# Patient Record
Sex: Female | Born: 2000 | Race: White | Hispanic: No | Marital: Single | Smoking: Never smoker
Health system: Southern US, Community
[De-identification: ages and names within clinical notes are randomized; demographics above are authoritative.]

## PROBLEM LIST (undated history)

## (undated) DIAGNOSIS — F329 Major depressive disorder, single episode, unspecified: Secondary | ICD-10-CM

## (undated) DIAGNOSIS — F32A Depression, unspecified: Secondary | ICD-10-CM

## (undated) DIAGNOSIS — T7840XA Allergy, unspecified, initial encounter: Secondary | ICD-10-CM

## (undated) DIAGNOSIS — F909 Attention-deficit hyperactivity disorder, unspecified type: Secondary | ICD-10-CM

## (undated) DIAGNOSIS — F419 Anxiety disorder, unspecified: Secondary | ICD-10-CM

## (undated) HISTORY — DX: Allergy, unspecified, initial encounter: T78.40XA

## (undated) HISTORY — DX: Major depressive disorder, single episode, unspecified: F32.9

## (undated) HISTORY — DX: Anxiety disorder, unspecified: F41.9

## (undated) HISTORY — DX: Attention-deficit hyperactivity disorder, unspecified type: F90.9

## (undated) HISTORY — DX: Depression, unspecified: F32.A

---
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
## 2019-01-08 ENCOUNTER — Ambulatory Visit (INDEPENDENT_AMBULATORY_CARE_PROVIDER_SITE_OTHER): Payer: Medicaid Other | Admitting: Family Medicine

## 2019-01-08 ENCOUNTER — Encounter: Payer: Self-pay | Admitting: Family Medicine

## 2019-01-08 VITALS — BP 114/67 | HR 89 | Temp 98.6°F | Ht 65.0 in | Wt 223.0 lb

## 2019-01-08 DIAGNOSIS — F411 Generalized anxiety disorder: Secondary | ICD-10-CM | POA: Diagnosis not present

## 2019-01-08 DIAGNOSIS — F329 Major depressive disorder, single episode, unspecified: Secondary | ICD-10-CM | POA: Diagnosis not present

## 2019-01-08 DIAGNOSIS — F909 Attention-deficit hyperactivity disorder, unspecified type: Secondary | ICD-10-CM | POA: Diagnosis not present

## 2019-01-08 DIAGNOSIS — F32A Depression, unspecified: Secondary | ICD-10-CM

## 2019-01-08 DIAGNOSIS — Z7251 High risk heterosexual behavior: Secondary | ICD-10-CM

## 2019-01-08 DIAGNOSIS — Z7689 Persons encountering health services in other specified circumstances: Secondary | ICD-10-CM

## 2019-01-08 DIAGNOSIS — Z23 Encounter for immunization: Secondary | ICD-10-CM

## 2019-01-08 LAB — PREGNANCY, URINE: Preg Test, Ur: NEGATIVE

## 2019-01-08 MED ORDER — FLUOXETINE HCL 20 MG PO TABS: 20.0000 mg | ORAL_TABLET | Freq: Every day | ORAL | 1 refills | Status: DC

## 2019-01-08 NOTE — Progress Notes (Signed)
Subjective: DI:YMEBRAXEN care, depression/ anxiety HPI: Vickie Dillon is a 18 y.o. female presenting to clinic today for:  1. Depression/ anxiety Patient with known history of depression and anxiety.  She is actually under the care of Dr. Lloyd Huger, with psychiatry.  She notes that she was recently prescribed Wellbutrin for depressive symptoms and had told her physician that she also had significant anxiety symptoms that were causing her to miss school.  She was previously treated with Lexapro and was transition from Lexapro to Wellbutrin because it was not working.  However, she does go on to state that she had only taken it intermittently for about 2 weeks before discontinuing the medicine.  She identifies remembering to take her medication as a barrier to treatment.  She is also treated for ADHD with Concerta.  Her next visit with her psychiatrist is in April.  Her mother is present during the exam who notes that she personally had difficulties with Wellbutrin increasing anxiety and does wish to transition her over to something like Prozac.  Family history significant for bipolar disorder in multiple family members.  Mother's history significant for generalized anxiety disorder, PTSD.  Father's history significant for bipolar disorder.  Patient is not on contraception.  Past Medical History:  Diagnosis Date  . Allergy   . Anxiety   . Depression    Past Surgical History:  Procedure Laterality Date  . TONSILLECTOMY AND ADENOIDECTOMY  2006  . TYMPANOSTOMY TUBE PLACEMENT Bilateral 2006   Social History   Socioeconomic History  . Marital status: Single    Spouse name: Not on file  . Number of children: Not on file  . Years of education: Not on file  . Highest education level: Not on file  Occupational History  . 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 Financial resource strain: Not on file  . Food insecurity:    Worry: Not on file    Inability: Not on file  .  Transportation needs:    Medical: Not on file    Non-medical: Not on file  Tobacco Use  . Smoking status: Never Smoker  . Smokeless tobacco: Never Used  Substance and Sexual Activity  . Alcohol use: Not Currently    Comment: occ  . Drug use: Not Currently  . Sexual activity: Yes    Birth control/protection: Condom  Lifestyle  . Physical activity:    Days per week: Not on file    Minutes per session: Not on file  . Stress: Not on file  Relationships  . Social connections:    Talks on phone: Not on file    Gets together: Not on file    Attends religious service: Not on file    Active member of club or organization: Not on file    Attends meetings of clubs or organizations: Not on file    Relationship status: Not on file  . Intimate partner violence:    Fear of current or ex partner: Not on file    Emotionally abused: Not on file    Physically abused: Not on file    Forced sexual activity: Not on file  Other Topics Concern  . Not on file  Social History Narrative   Patient is in her 1st year at [employer] studying psychology.  She has a boyfriend.   Current Meds  Medication Sig  . buPROPion (WELLBUTRIN XL) 150 MG 24 hr tablet Take 150 mg by mouth daily.  Marland Kitchen  methylphenidate 27 MG PO CR tablet Take 27 mg by mouth every morning.   Family History  Problem Relation Age of Onset  . Hypertension Mother   . Hypothyroidism Mother   . Depression Mother   . Anxiety disorder Mother   . Bipolar disorder Mother   . Hyperlipidemia Mother   . Migraines Mother   . Multiple sclerosis Father   . Depression Father   . Bipolar disorder Father   . Hypertension Father   . Anxiety disorder Maternal Grandmother   . Hyperlipidemia Maternal Grandfather   . Hypertension Maternal Grandfather   . Nephrolithiasis Maternal Grandfather   . Diabetes Paternal Grandmother   . Diabetes Paternal Grandfather   . Heart attack Paternal Grandfather   . Anxiety disorder Half-Brother   . Migraines  Half-Sister   . Bipolar disorder Half-Sister   . Anxiety disorder Half-Sister    No Known Allergies   Health Maintenance: Flu shot  ROS: Per HPI  Objective: Office vital signs reviewed. BP 114/67   Pulse 89   Temp 98.6 F (37 C) (Oral)   Ht 5\' 5"  (1.651 m)   Wt 223 lb (101.2 kg)   BMI 37.11 kg/m   Physical Examination:  General: Awake, alert, obese, No acute distress HEENT: Normal, sclera white, MMM Cardio: regular rate and rhythm, S1S2 heard, no murmurs appreciated Pulm: clear to auscultation bilaterally, no wheezes, rhonchi or rales; normal work of breathing on room air Extremities: warm, well perfused, No edema, cyanosis or clubbing; +2 pulses bilaterally Psych: mood stable. Speech normal.  Affect appropriate. Depression screen PHQ 2/9 01/08/2019  Decreased Interest 1  Down, Depressed, Hopeless 1  PHQ - 2 Score 2  Altered sleeping 3  Tired, decreased energy 3  Change in appetite 2  Feeling bad or failure about yourself  3  Trouble concentrating 2  Moving slowly or fidgety/restless 1  Suicidal thoughts 1  PHQ-9 Score 17    GAD 7 : Generalized Anxiety Score 01/08/2019  Nervous, Anxious, on Edge 2  Control/stop worrying 1  Worry too much - different things 3  Trouble relaxing 1  Restless 3  Easily annoyed or irritable 2  Afraid - awful might happen 3  Total GAD 7 Score 15  Anxiety Difficulty Somewhat difficult   Assessment/ Plan: 18 y.o. female   1. Depressive disorder Not controlled.  Start fluoxetine 20 mg daily.  Hold off on Wellbutrin for now.  Urine pregnancy was collected given inconsistent use of contraception.  Follow up in 6 weeks with me unless she sees psychiatry first. - FLUoxetine (PROZAC) 20 MG tablet; Take 1 tablet (20 mg total) by mouth daily.  Dispense: 30 tablet; Refill: 1 - Pregnancy, urine  2. Establishing care with new doctor, encounter for Awaiting records from previous PCP  3. Generalized anxiety disorder Not controlled.  Start  fluoxetine. - FLUoxetine (PROZAC) 20 MG tablet; Take 1 tablet (20 mg total) by mouth daily.  Dispense: 30 tablet; Refill: 1 - Pregnancy, urine  4. Attention deficit hyperactivity disorder (ADHD), unspecified ADHD type Followed by psychiatry.  Manages Concerta  5. High risk heterosexual behavior As above. - Pregnancy, urine   Raliegh Ip, DO [facility] [phone]

## 2019-01-08 NOTE — Patient Instructions (Signed)
Taking the medicine as directed and not missing any doses is one of the best things you can do to treat your anxiety/ depression.  Here are some things to keep in mind:  1) Side effects (stomach upset, some increased anxiety) may happen before you notice a benefit.  These side effects typically go away over time. 2) Changes to your dose of medicine or a change in medication all together is sometimes necessary 3) Most people need to be on medication at least 12 months 4) Many people will notice an improvement within two weeks but the full effect of the medication can take up to 4-6 weeks 5) Stopping the medication when you start feeling better often results in a return of symptoms 6) Never discontinue your medication without contacting a health care professional first.  Some medications require gradual discontinuation/ taper and can make you sick if you stop them abruptly.  If your symptoms worsen or you have thoughts of suicide/homicide, PLEASE SEEK IMMEDIATE MEDICAL ATTENTION.  You may always call:  National Suicide Hotline: 800-273-8255  Crisis Line: 336-832-9700 Crisis Recovery in [place]: 800-939-5911   These are available 24 hours a day, 7 days a week.   

## 2019-01-11 DIAGNOSIS — Z23 Encounter for immunization: Secondary | ICD-10-CM | POA: Diagnosis not present

## 2019-02-25 ENCOUNTER — Other Ambulatory Visit: Payer: Self-pay

## 2019-02-25 ENCOUNTER — Ambulatory Visit (INDEPENDENT_AMBULATORY_CARE_PROVIDER_SITE_OTHER): Payer: Medicaid Other | Admitting: Family Medicine

## 2019-02-25 DIAGNOSIS — F411 Generalized anxiety disorder: Secondary | ICD-10-CM | POA: Diagnosis not present

## 2019-02-25 DIAGNOSIS — F32A Depression, unspecified: Secondary | ICD-10-CM

## 2019-02-25 DIAGNOSIS — F329 Major depressive disorder, single episode, unspecified: Secondary | ICD-10-CM

## 2019-02-25 MED ORDER — FLUOXETINE HCL 40 MG PO CAPS: 40.0000 mg | ORAL_CAPSULE | Freq: Every day | ORAL | 1 refills | Status: DC

## 2019-02-25 NOTE — Progress Notes (Signed)
Telephone visit  Subjective: CC: f/u GAD, depression PCP: Raliegh Ip, DO BUY:[id number] Kulbacki is a 18 y.o. female calls for telephone consult today. Patient provides verbal consent for consult held via phone.  Location of patient: home Location of provider: [facility] Others present for call: none  1. GAD/ depression Patient reports that her family seems to think that she is doing better on the addition of Prozac 20 mg daily.  They feel that her mood is better though she has not seen a great deal of change herself.  She continues to have intermittent panic episodes and notes that she has had 2 since the last visit.  She continues to take Wellbutrin as prescribed as well as her Concerta as prescribed by her specialist.  She continues to feel fidgety and have difficulty with sleep.  No SI, HI.  ROS: Per HPI  No Known Allergies Past Medical History:  Diagnosis Date  . ADHD   . Allergy   . Anxiety   . Depression     Current Outpatient Medications:  .  buPROPion (WELLBUTRIN XL) 150 MG 24 hr tablet, Take 150 mg by mouth daily., Disp: , Rfl:  .  FLUoxetine (PROZAC) 20 MG tablet, Take 1 tablet (20 mg total) by mouth daily., Disp: 30 tablet, Rfl: 1 .  methylphenidate 27 MG PO CR tablet, Take 27 mg by mouth every morning., Disp: , Rfl:   Depression screen [facility] 2/9 02/25/2019 01/08/2019  Decreased Interest 0 1  Down, Depressed, Hopeless 2 1  PHQ - 2 Score 2 2  Altered sleeping 3 3  Tired, decreased energy 2 3  Change in appetite 1 2  Feeling bad or failure about yourself  3 3  Trouble concentrating 0 2  Moving slowly or fidgety/restless 0 1  Suicidal thoughts 0 1  PHQ-9 Score 11 17  Difficult doing work/chores Somewhat difficult -   GAD 7 : Generalized Anxiety Score 02/25/2019 01/08/2019  Nervous, Anxious, on Edge 3 2  Control/stop worrying 2 1  Worry too much - different things 1 3  Trouble relaxing 0 1  Restless 3 3  Easily annoyed or irritable 0 2  Afraid - awful might happen 2 3   Total GAD 7 Score 11 15  Anxiety Difficulty Not difficult at all Somewhat difficult    Assessment/ Plan: 18 y.o. female   1. Depressive disorder Improving but not controlled.  We discussed options including an increase in the SSRI versus addition of BuSpar since most symptoms seem to be predominantly anxiety driven.  She wants to proceed with increase Prozac to 40 mg daily.  She will follow-up with me in about 6 weeks.  The appointment has been scheduled. - FLUoxetine (PROZAC) 40 MG capsule; Take 1 capsule (40 mg total) by mouth daily.  Dispense: 30 capsule; Refill: 1  2. Generalized anxiety disorder As above - FLUoxetine (PROZAC) 40 MG capsule; Take 1 capsule (40 mg total) by mouth daily.  Dispense: 30 capsule; Refill: 1   Start time: 8:00am End time: 8:10am  Total time spent on patient care (including telephone call/ virtual visit): 15 minutes  Ashly Hulen Skains, DO [facility] [phone]

## 2019-03-07 ENCOUNTER — Ambulatory Visit (INDEPENDENT_AMBULATORY_CARE_PROVIDER_SITE_OTHER): Payer: Medicaid Other | Admitting: Family Medicine

## 2019-03-07 ENCOUNTER — Other Ambulatory Visit: Payer: Self-pay

## 2019-03-07 ENCOUNTER — Encounter: Payer: Self-pay | Admitting: Family Medicine

## 2019-03-07 VITALS — BP 111/64 | HR 69 | Temp 97.3°F | Ht 65.0 in | Wt 213.0 lb

## 2019-03-07 DIAGNOSIS — Z113 Encounter for screening for infections with a predominantly sexual mode of transmission: Secondary | ICD-10-CM | POA: Diagnosis not present

## 2019-03-07 NOTE — Patient Instructions (Signed)

## 2019-03-07 NOTE — Progress Notes (Signed)
Subjective:  Patient ID: Vickie Dillon, female    DOB: [birth year], 18 y.o.   MRN: [id number]  Chief Complaint:  Exposure to STD   HPI: Vickie Dillon is a 18 y.o. female presenting on 03/07/2019 for Exposure to STD   1. Routine screening for STI (sexually transmitted infection)   Pt presents today for STI testing. Pt states she would like to be tested for gonorrhea and chlamydia. States she has had chlamydia in the past. States she has only been with one female partner. States she is unsure if he is faithful. States her last unprotected intercourse was in Jan 2020. Pt denies vaginal discharge, bleeding, or pain. No dyspareunia. No abdominal, pelvic, or flank pain. No vaginal pruritis. No dysuria, fever, chills, weakness, or confusion.    Relevant past medical, surgical, family, and social history reviewed and updated as indicated.  Allergies and medications reviewed and updated.   Past Medical History:  Diagnosis Date  . ADHD   . Allergy   . Anxiety   . Depression     Past Surgical History:  Procedure Laterality Date  . TONSILLECTOMY AND ADENOIDECTOMY  2006  . TYMPANOSTOMY TUBE PLACEMENT Bilateral 2006    Social History   Socioeconomic History  . Marital status: Single    Spouse name: Not on file  . Number of children: Not on file  . Years of education: Not on file  . Highest education level: Not on file  Occupational History  . 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 Financial resource strain: Not on file  . Food insecurity:    Worry: Not on file    Inability: Not on file  . Transportation needs:    Medical: Not on file    Non-medical: Not on file  Tobacco Use  . Smoking status: Never Smoker  . Smokeless tobacco: Never Used  Substance and Sexual Activity  . Alcohol use: Not Currently    Comment: occ  . Drug use: Not Currently  . Sexual activity: Yes    Birth control/protection: Condom  Lifestyle  . Physical activity:    Days per  week: Not on file    Minutes per session: Not on file  . Stress: Not on file  Relationships  . Social connections:    Talks on phone: Not on file    Gets together: Not on file    Attends religious service: Not on file    Active member of club or organization: Not on file    Attends meetings of clubs or organizations: Not on file    Relationship status: Not on file  . Intimate partner violence:    Fear of current or ex partner: Not on file    Emotionally abused: Not on file    Physically abused: Not on file    Forced sexual activity: Not on file  Other Topics Concern  . Not on file  Social History Narrative   Patient is in her 1st year at [employer] studying psychology.  She has a boyfriend.    Outpatient Encounter Medications as of 03/07/2019  Medication Sig  . FLUoxetine (PROZAC) 40 MG capsule Take 1 capsule (40 mg total) by mouth daily.  . methylphenidate 27 MG PO CR tablet Take 27 mg by mouth every morning.  Marland Kitchen buPROPion (WELLBUTRIN XL) 150 MG 24 hr tablet Take 150 mg by mouth daily.   No facility-administered encounter medications on file as of 03/07/2019.  No Known Allergies  Review of Systems  Constitutional: Negative for chills, fatigue and fever.  Gastrointestinal: Negative for abdominal pain and rectal pain.  Genitourinary: Negative for decreased urine volume, difficulty urinating, dyspareunia, dysuria, enuresis, flank pain, frequency, genital sores, hematuria, menstrual problem, pelvic pain, urgency, vaginal bleeding, vaginal discharge and vaginal pain.  Musculoskeletal: Negative for arthralgias, joint swelling and myalgias.  Neurological: Negative for weakness and headaches.  Psychiatric/Behavioral: Negative for confusion.  All other systems reviewed and are negative.       Objective:  BP 111/64   Pulse 69   Temp (!) 97.3 F (36.3 C)   Ht 5\' 5"  (1.651 m)   Wt 213 lb (96.6 kg)   BMI 35.45 kg/m    Wt Readings from Last 3 Encounters:  03/07/19 213  lb (96.6 kg) (98 %, Z= 2.12)*  01/08/19 223 lb (101.2 kg) (99 %, Z= 2.24)*   * Growth percentiles are based on CDC (Girls, 2-20 Years) data.    Physical Exam Vitals signs and nursing note reviewed.  Constitutional:      General: She is not in acute distress.    Appearance: Normal appearance. She is not ill-appearing or toxic-appearing.  HENT:     Head: Normocephalic and atraumatic.     Mouth/Throat:     Mouth: Mucous membranes are moist.     Pharynx: Oropharynx is clear.  Eyes:     Pupils: Pupils are equal, round, and reactive to light.  Neck:     Musculoskeletal: Normal range of motion and neck supple.  Cardiovascular:     Rate and Rhythm: Normal rate and regular rhythm.     Heart sounds: Normal heart sounds. No murmur. No friction rub. No gallop.   Pulmonary:     Effort: Pulmonary effort is normal. No respiratory distress.     Breath sounds: Normal breath sounds.  Abdominal:     General: Bowel sounds are normal.     Palpations: Abdomen is soft.     Tenderness: There is no abdominal tenderness. There is no right CVA tenderness or left CVA tenderness.  Genitourinary:    Comments: Pelvic deferred, pt declined Musculoskeletal:        General: No swelling.  Skin:    General: Skin is warm and dry.     Capillary Refill: Capillary refill takes less than 2 seconds.     Coloration: Skin is not pale.  Neurological:     General: No focal deficit present.     Mental Status: She is alert and oriented to person, place, and time.  Psychiatric:        Mood and Affect: Mood normal.        Behavior: Behavior normal.        Thought Content: Thought content normal.        Judgment: Judgment normal.     Results for orders placed or performed in visit on 01/08/19  Pregnancy, urine  Result Value Ref Range   Preg Test, Ur Negative Negative       Pertinent labs & imaging results that were available during my care of the patient were reviewed by me and considered in my medical decision  making.  Assessment & Plan:  [name] was seen today for exposure to std.  Diagnoses and all orders for this visit:  Routine screening for STI (sexually transmitted infection) Pt denies symptoms. Wants to be tested for GC, Chlamydia. Denies blood work for HIV / Hep B. Labs pending. Will treat if warranted. No  sexual contact until labs result and treatment is completed if warranted.  -     Chlamydia/Gonococcus/Trichomonas, NAA     Continue all other maintenance medications.  Follow up plan: Return if symptoms worsen or fail to improve.  Educational handout given for safe sex practices.   The above assessment and management plan was discussed with the patient. The patient verbalized understanding of and has agreed to the management plan. Patient is aware to call the clinic if symptoms persist or worsen. Patient is aware when to return to the clinic for a follow-up visit. Patient educated on when it is appropriate to go to the emergency department.   Kari Baars, FNP-C [facility] [phone]

## 2019-03-11 LAB — CHLAMYDIA/GONOCOCCUS/TRICHOMONAS, NAA
Chlamydia by NAA: NEGATIVE
Gonococcus by NAA: NEGATIVE
Trich vag by NAA: NEGATIVE

## 2019-03-13 ENCOUNTER — Other Ambulatory Visit: Payer: Self-pay

## 2019-03-13 ENCOUNTER — Ambulatory Visit (INDEPENDENT_AMBULATORY_CARE_PROVIDER_SITE_OTHER): Payer: Medicaid Other | Admitting: Family Medicine

## 2019-03-13 DIAGNOSIS — R1032 Left lower quadrant pain: Secondary | ICD-10-CM

## 2019-03-13 NOTE — Progress Notes (Signed)
Telephone visit  Subjective: CC: Abdominal pain PCP: Raliegh Ip, DO [id number] Vickie Dillon is a 18 y.o. female calls for telephone consult today. Patient provides verbal consent for consult held via phone.  Location of patient: home Location of provider: Working remotely from home Others present for call: none  1.  Left-sided abdominal pain Patient reports acute onset of left-sided abdominal pain this morning.  She notes that it is crampy in nature with intermittent sharpness.  She denies any abnormal vaginal discharge, irregular bleeding.  She is had mild nausea over the last 1 to 2 hours but no vomiting.  No fevers.  Her last menstrual cycle was February 03, 2019.  She was evaluated in the office about a week ago for STI testing, as she is sexually active and uses no contraception at this time.  Her STI tests were negative.  She goes on to state that she was using a Nexplanon but this caused severe GI symptoms and therefore it was removed.   ROS: Per HPI  No Known Allergies Past Medical History:  Diagnosis Date  . ADHD   . Allergy   . Anxiety   . Depression     Current Outpatient Medications:  .  buPROPion (WELLBUTRIN XL) 150 MG 24 hr tablet, Take 150 mg by mouth daily., Disp: , Rfl:  .  FLUoxetine (PROZAC) 40 MG capsule, Take 1 capsule (40 mg total) by mouth daily., Disp: 30 capsule, Rfl: 1 .  methylphenidate 27 MG PO CR tablet, Take 27 mg by mouth every morning., Disp: , Rfl:   Assessment/ Plan: 18 y.o. female   1. Acute left lower quadrant pain Patient has missed her period by over a week now.  We cannot rule out pregnancy/ectopic pregnancy.  I have highly recommended that she consider taking a home pregnancy test or coming to the office to have this test done.  At this time, I do not think that her symptoms are related to pelvic infection as she had STI tests done a week ago and they were negative.  I did review her ultrasound of the ovaries from 2019 which demonstrated a  small simple cyst on the left ovary.  Mittelschmerz was considered but at this point she is overdue for menstrual cycle so I do not think that that is likely the diagnosis.  Her symptoms certainly could be related to her trying to start a menstrual cycle and I discussed this with her.  If her pregnancy test is negative, I would recommend proceeding with oral NSAIDs.  We discussed using 400 to 600 mg of Motrin every 6-8 hours as needed pain (this needs to be separate from her SSRI).  We discussed reasons for emergent evaluation emergency department.  She voiced good understanding will follow-up PRN  Start time: 2:54pm End time: 3:02pm  Total time spent on patient care (including telephone call/ virtual visit):  Mysha Peeler Hulen Skains, DO [facility] [phone]

## 2019-03-18 DIAGNOSIS — Z79899 Other long term (current) drug therapy: Secondary | ICD-10-CM | POA: Diagnosis not present

## 2019-03-18 DIAGNOSIS — R079 Chest pain, unspecified: Secondary | ICD-10-CM | POA: Diagnosis not present

## 2019-03-18 DIAGNOSIS — R0689 Other abnormalities of breathing: Secondary | ICD-10-CM | POA: Diagnosis not present

## 2019-03-18 DIAGNOSIS — R0781 Pleurodynia: Secondary | ICD-10-CM | POA: Diagnosis not present

## 2019-04-22 ENCOUNTER — Ambulatory Visit: Payer: Self-pay | Admitting: Family Medicine

## 2019-05-06 ENCOUNTER — Other Ambulatory Visit: Payer: Self-pay

## 2019-05-06 ENCOUNTER — Ambulatory Visit (INDEPENDENT_AMBULATORY_CARE_PROVIDER_SITE_OTHER): Payer: Medicaid Other | Admitting: Family Medicine

## 2019-05-06 DIAGNOSIS — F329 Major depressive disorder, single episode, unspecified: Secondary | ICD-10-CM | POA: Diagnosis not present

## 2019-05-06 DIAGNOSIS — F411 Generalized anxiety disorder: Secondary | ICD-10-CM

## 2019-05-06 DIAGNOSIS — F32A Depression, unspecified: Secondary | ICD-10-CM

## 2019-05-06 MED ORDER — FLUOXETINE HCL 40 MG PO CAPS: 40.0000 mg | ORAL_CAPSULE | Freq: Every day | ORAL | 5 refills | Status: DC

## 2019-05-06 NOTE — Progress Notes (Signed)
Telephone visit  Subjective: CC: f/u GAD, depression PCP: Janora Norlander, DO [id number] Neyra is a 18 y.o. female calls for telephone consult today. Patient provides verbal consent for consult held via phone.  Location of patient: home Location of provider: [facility] Others present for call: none  1. GAD/ depression Dose was increased to Prozac 40 mg daily. She reports that her depressive symptoms have gotten a lot better on the increased dose.  She reports 1 panic episode since last visit.  This is getting better.  She continues to take Wellbutrin as prescribed as well as her Concerta as prescribed by her specialist.  She is unsure as to when she has follow up.  Sleep is really good.  No SI, HI.  ROS: Per HPI  No Known Allergies Past Medical History:  Diagnosis Date  . ADHD   . Allergy   . Anxiety   . Depression     Current Outpatient Medications:  .  buPROPion (WELLBUTRIN XL) 150 MG 24 hr tablet, Take 150 mg by mouth daily., Disp: , Rfl:  .  FLUoxetine (PROZAC) 40 MG capsule, Take 1 capsule (40 mg total) by mouth daily., Disp: 30 capsule, Rfl: 1 .  methylphenidate 27 MG PO CR tablet, Take 27 mg by mouth every morning., Disp: , Rfl:   Depression screen [facility] 2/9 05/06/2019 02/25/2019 01/08/2019  Decreased Interest 1 0 1  Down, Depressed, Hopeless 1 2 1   PHQ - 2 Score 2 2 2   Altered sleeping 1 3 3   Tired, decreased energy 0 2 3  Change in appetite 1 1 2   Feeling bad or failure about yourself  3 3 3   Trouble concentrating 0 0 2  Moving slowly or fidgety/restless 0 0 1  Suicidal thoughts 1 0 1  PHQ-9 Score 8 11 17   Difficult doing work/chores - Somewhat difficult -   GAD 7 : Generalized Anxiety Score 05/06/2019 02/25/2019 01/08/2019  Nervous, Anxious, on Edge 2 3 2   Control/stop worrying 2 2 1   Worry too much - different things 3 1 3   Trouble relaxing 1 0 1  Restless 3 3 3   Easily annoyed or irritable 0 0 2  Afraid - awful might happen 2 2 3   Total GAD 7 Score 13 11 15    Anxiety Difficulty Somewhat difficult Not difficult at all Somewhat difficult    Assessment/ Plan: 18 y.o. female   1. Depressive disorder Under better control than previous visit.  She would like to stick with a 40 mg daily, which she is intermittently compliant with.  We reinforced need for daily use of this medicine.  She will follow-up in 3 to 6 months, sooner if needed - FLUoxetine (PROZAC) 40 MG capsule; Take 1 capsule (40 mg total) by mouth daily.  Dispense: 30 capsule; Refill: 5  2. Generalized anxiety disorder Essentially stable.  This seems to wax and wane.  She does not want to have any medication adjustments at this time.  I encouraged her to continue following up with her psychiatric provider, which she will schedule an appointment with today. - FLUoxetine (PROZAC) 40 MG capsule; Take 1 capsule (40 mg total) by mouth daily.  Dispense: 30 capsule; Refill: 5    Start time: 10:11am End time: 10:18am  Total time spent on patient care (including telephone call/ virtual visit): 13 minutes  [place], [place] [phone]

## 2019-05-28 ENCOUNTER — Ambulatory Visit (INDEPENDENT_AMBULATORY_CARE_PROVIDER_SITE_OTHER): Payer: Medicaid Other | Admitting: Family Medicine

## 2019-05-28 ENCOUNTER — Encounter: Payer: Self-pay | Admitting: Family Medicine

## 2019-05-28 ENCOUNTER — Other Ambulatory Visit: Payer: Self-pay

## 2019-05-28 VITALS — BP 106/66 | HR 77 | Temp 98.0°F | Ht 60.0 in | Wt 228.0 lb

## 2019-05-28 DIAGNOSIS — Z7251 High risk heterosexual behavior: Secondary | ICD-10-CM | POA: Diagnosis not present

## 2019-05-28 DIAGNOSIS — N760 Acute vaginitis: Secondary | ICD-10-CM

## 2019-05-28 DIAGNOSIS — B9689 Other specified bacterial agents as the cause of diseases classified elsewhere: Secondary | ICD-10-CM

## 2019-05-28 DIAGNOSIS — Z202 Contact with and (suspected) exposure to infections with a predominantly sexual mode of transmission: Secondary | ICD-10-CM | POA: Diagnosis not present

## 2019-05-28 LAB — WET PREP FOR TRICH, YEAST, CLUE
Clue Cell Exam: POSITIVE — AB
Trichomonas Exam: NEGATIVE
Yeast Exam: NEGATIVE

## 2019-05-28 LAB — PREGNANCY, URINE: Preg Test, Ur: NEGATIVE

## 2019-05-28 MED ORDER — AZITHROMYCIN 500 MG PO TABS: 1000.0000 mg | ORAL_TABLET | Freq: Once | ORAL | 0 refills | Status: AC

## 2019-05-28 MED ORDER — METRONIDAZOLE 500 MG PO TABS: 500.0000 mg | ORAL_TABLET | Freq: Two times a day (BID) | ORAL | 0 refills | Status: DC

## 2019-05-28 NOTE — Progress Notes (Signed)
Subjective:  Patient ID: Vickie Dillon, female    DOB: 09/05/2001, 18 y.o.   MRN: [id number]  Patient Care Team: Raliegh IpGottschalk, Ashly M, DO as PCP - General (Family Medicine)   Chief Complaint:  Exposure to STD (wants to be checked)   HPI: Vickie Dillon is a 18 y.o. female presenting on 05/28/2019 for Exposure to STD (wants to be checked)  Pt states her partner notified her that he tested positive for chlamydia. Pt states she has had unprotected sex with him within the last month. She denies vaginal symptoms. No bleeding, discharge, pain, or pruritis. Pt denies dyspareunia. No abdominal pain, dysuria, or flank pain. No arthralgias, myalgias, genital lesions, or joint swelling. She has had unprotected with one other female partner who has been notified.    Relevant past medical, surgical, family, and social history reviewed and updated as indicated.  Allergies and medications reviewed and updated. Date reviewed: Chart in Epic.   Past Medical History:  Diagnosis Date  . ADHD   . Allergy   . Anxiety   . Depression     Past Surgical History:  Procedure Laterality Date  . TONSILLECTOMY AND ADENOIDECTOMY  2006  . TYMPANOSTOMY TUBE PLACEMENT Bilateral 2006    Social History   Socioeconomic History  . Marital status: Single    Spouse name: Not on file  . Number of children: Not on file  . Years of education: Not on file  . Highest education level: Not on file  Occupational History  . 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 Financial resource strain: Not on file  . Food insecurity    Worry: Not on file    Inability: Not on file  . Transportation needs    Medical: Not on file    Non-medical: Not on file  Tobacco Use  . Smoking status: Never Smoker  . Smokeless tobacco: Never Used  Substance and Sexual Activity  . Alcohol use: Not Currently    Comment: occ  . Drug use: Not Currently  . Sexual activity: Yes    Birth control/protection: Condom   Lifestyle  . Physical activity    Days per week: Not on file    Minutes per session: Not on file  . Stress: Not on file  Relationships  . Social Musicianconnections    Talks on phone: Not on file    Gets together: Not on file    Attends religious service: Not on file    Active member of club or organization: Not on file    Attends meetings of clubs or organizations: Not on file    Relationship status: Not on file  . Intimate partner violence    Fear of current or ex partner: Not on file    Emotionally abused: Not on file    Physically abused: Not on file    Forced sexual activity: Not on file  Other Topics Concern  . Not on file  Social History Narrative   Patient is in her 1st year at [employer] studying psychology.  She has a boyfriend.    Outpatient Encounter Medications as of 05/28/2019  Medication Sig  . FLUoxetine (PROZAC) 40 MG capsule Take 1 capsule (40 mg total) by mouth daily.  . methylphenidate 27 MG PO CR tablet Take 27 mg by mouth every morning.  Marland Kitchen. azithromycin (ZITHROMAX) 500 MG tablet Take 2 tablets (1,000 mg total) by mouth once for 1 dose.  . metroNIDAZOLE (FLAGYL)  500 MG tablet Take 1 tablet (500 mg total) by mouth 2 (two) times daily.  . [DISCONTINUED] buPROPion (WELLBUTRIN XL) 150 MG 24 hr tablet Take 150 mg by mouth daily.   No facility-administered encounter medications on file as of 05/28/2019.     No Known Allergies  Review of Systems  Constitutional: Negative for activity change, appetite change, chills, diaphoresis, fatigue, fever and unexpected weight change.  Respiratory: Negative for cough, chest tightness and shortness of breath.   Cardiovascular: Negative for chest pain, palpitations and leg swelling.  Gastrointestinal: Negative for abdominal distention, abdominal pain, anal bleeding, blood in stool, constipation, diarrhea, nausea, rectal pain and vomiting.  Genitourinary: Negative for decreased urine volume, difficulty urinating, dyspareunia,  dysuria, enuresis, flank pain, frequency, genital sores, hematuria, menstrual problem, pelvic pain, urgency, vaginal bleeding, vaginal discharge and vaginal pain.  Musculoskeletal: Negative for arthralgias, joint swelling and myalgias.  Skin: Negative for color change, pallor, rash and wound.  Neurological: Negative for dizziness, weakness, light-headedness and headaches.  Psychiatric/Behavioral: Negative for confusion.  All other systems reviewed and are negative.       Objective:  BP 106/66   Pulse 77   Temp 98 F (36.7 C) (Oral)   Ht 5' (1.524 m)   Wt 228 lb (103.4 kg)   LMP 05/05/2019   BMI 44.53 kg/m    Wt Readings from Last 3 Encounters:  05/28/19 228 lb (103.4 kg)  03/07/19 213 lb (96.6 kg) (98 %, Z= 2.12)*  01/08/19 223 lb (101.2 kg) (99 %, Z= 2.24)*   * Growth percentiles are based on CDC (Girls, 2-20 Years) data.    Physical Exam Vitals signs and nursing note reviewed.  Constitutional:      General: She is not in acute distress.    Appearance: Normal appearance. She is well-developed and well-groomed. She is not ill-appearing, toxic-appearing or diaphoretic.  HENT:     Head: Normocephalic and atraumatic.     Jaw: There is normal jaw occlusion.     Right Ear: Hearing normal.     Left Ear: Hearing normal.     Nose: Nose normal.     Mouth/Throat:     Lips: Pink.     Mouth: Mucous membranes are moist.     Pharynx: Oropharynx is clear. Uvula midline.  Eyes:     General: Lids are normal.     Extraocular Movements: Extraocular movements intact.     Conjunctiva/sclera: Conjunctivae normal.     Pupils: Pupils are equal, round, and reactive to light.  Neck:     Musculoskeletal: Normal range of motion and neck supple.     Thyroid: No thyroid mass, thyromegaly or thyroid tenderness.     Vascular: No carotid bruit or JVD.     Trachea: Trachea and phonation normal.  Cardiovascular:     Rate and Rhythm: Normal rate and regular rhythm.     Chest Wall: PMI is not  displaced.     Pulses: Normal pulses.     Heart sounds: Normal heart sounds. No murmur. No friction rub. No gallop.   Pulmonary:     Effort: Pulmonary effort is normal. No respiratory distress.     Breath sounds: Normal breath sounds. No wheezing.  Abdominal:     General: Bowel sounds are normal. There is no distension or abdominal bruit.     Palpations: Abdomen is soft. There is no hepatomegaly or splenomegaly.     Tenderness: There is no abdominal tenderness. There is no right CVA tenderness or left  CVA tenderness.     Hernia: No hernia is present.  Genitourinary:    Comments: Pt declined pelvic, pt opted for self wet prep Musculoskeletal: Normal range of motion.     Right lower leg: No edema.     Left lower leg: No edema.  Lymphadenopathy:     Cervical: No cervical adenopathy.  Skin:    General: Skin is warm and dry.     Capillary Refill: Capillary refill takes less than 2 seconds.     Coloration: Skin is not cyanotic, jaundiced or pale.     Findings: No rash.  Neurological:     General: No focal deficit present.     Mental Status: She is alert and oriented to person, place, and time.     Cranial Nerves: Cranial nerves are intact.     Sensory: Sensation is intact.     Motor: Motor function is intact.     Coordination: Coordination is intact.     Gait: Gait is intact.     Deep Tendon Reflexes: Reflexes are normal and symmetric.  Psychiatric:        Attention and Perception: Attention and perception normal.        Mood and Affect: Mood and affect normal.        Speech: Speech normal.        Behavior: Behavior normal. Behavior is cooperative.        Thought Content: Thought content normal.        Cognition and Memory: Cognition and memory normal.        Judgment: Judgment normal.     Results for orders placed or performed in visit on 05/28/19  WET PREP FOR TRICH, YEAST, CLUE   Specimen: Vaginal Fluid   VAGINAL FLUI  Result Value Ref Range   Trichomonas Exam Negative  Negative   Yeast Exam Negative Negative   Clue Cell Exam Positive (A) Negative  Pregnancy, urine  Result Value Ref Range   Preg Test, Ur Negative Negative     Urinalysis unremarkable in office Urine pregnancy negative Wet Pre positive for clue cells, negative for trich or yeast  Pertinent labs & imaging results that were available during my care of the patient were reviewed by me and considered in my medical decision making.  Assessment & Plan:  [name] was seen today for exposure to std.  Diagnoses and all orders for this visit:  Exposure to STD Exposure to chlamydia Partner tested positive for chlamydia. Will empirically treat with zithromax today. Other STI screening pending, will treat if warranted. Safe sex practices discussed. Report any new symptoms.  -     Chlamydia/Gonococcus/Trichomonas, NAA -     WET PREP FOR TRICH, YEAST, CLUE -     Pregnancy, urine -     STD Screen (8) -     azithromycin (ZITHROMAX) 500 MG tablet; Take 2 tablets (1,000 mg total) by mouth once for 1 dose.  High risk heterosexual behavior -     Chlamydia/Gonococcus/Trichomonas, NAA -     WET PREP FOR TRICH, YEAST, CLUE -     Pregnancy, urine -     STD Screen (8)  Bacterial vaginosis Wet prep positive for clue cells. Will treat with flagyl. No ETOH while taking medications. Vaginal hygiene discussed. Report any new or worsening symptoms.  -     metroNIDAZOLE (FLAGYL) 500 MG tablet; Take 1 tablet (500 mg total) by mouth 2 (two) times daily.   Continue all other maintenance medications.  Follow  up plan: Return if symptoms worsen or fail to improve.  Educational handout given for safe sex and vaginal hygiene   The above assessment and management plan was discussed with the patient. The patient verbalized understanding of and has agreed to the management plan. Patient is aware to call the clinic if symptoms persist or worsen. Patient is aware when to return to the clinic for a follow-up visit.  Patient educated on when it is appropriate to go to the emergency department.   Kari BaarsMichelle , FNP-C [facility] [phone] 05/28/19

## 2019-05-28 NOTE — Patient Instructions (Addendum)
Safe Sex Practicing safe sex means taking steps before and during sex to reduce your risk of:  Getting an STI (sexually transmitted infection).  Giving your partner an STI.  Unwanted or unplanned pregnancy. How can I practice safe sex?     Ways you can practice safe sex  Limit your sexual partners to only one partner who is having sex with only you.  Avoid using alcohol and drugs before having sex. Alcohol and drugs can affect your judgment.  Before having sex with a new partner: ? Talk to your partner about past partners, past STIs, and drug use. ? Get screened for STIs and discuss the results with your partner. Ask your partner to get screened, too.  Check your body regularly for sores, blisters, rashes, or unusual discharge. If you notice any of these problems, visit your health care provider.  Avoid sexual contact if you have symptoms of an infection or you are being treated for an STI.  While having sex, use a condom. Make sure to: ? Use a condom every time you have vaginal, oral, or anal sex. Both females and males should wear condoms during oral sex. ? Keep condoms in place from the beginning to the end of sexual activity. ? Use a latex condom, if possible. Latex condoms offer the best protection. ? Use only water-based lubricants with a condom. Using petroleum-based lubricants or oils will weaken the condom and increase the chance that it will break. Ways your health care provider can help you practice safe sex  See your health care provider for regular screenings, exams, and tests for STIs.  Talk with your health care provider about what kind of birth control (contraception) is best for you.  Get vaccinated against hepatitis B and human papillomavirus (HPV).  If you are at risk of being infected with HIV (human immunodeficiency virus), talk with your health care provider about taking a prescription medicine to prevent HIV infection. You are at risk for HIV if you: ?  Are a man who has sex with other men. ? Are sexually active with more than one partner. ? Take drugs by injection. ? Have a sex partner who has HIV. ? Have unprotected sex. ? Have sex with someone who has sex with both men and women. ? Have had an STI. Follow these instructions at home:  Take over-the-counter and prescription medicines as told by your health care provider.  Keep all follow-up visits as told by your health care provider. This is important. Where to find more information  Centers for Disease Control and Prevention: https://www.cdc.gov/std/prevention/default.htm  Planned Parenthood: https://www.plannedparenthood.org/  Office on Women's Health: https://www.womenshealth.gov/a-z-topics/sexually-transmitted-infections Summary  Practicing safe sex means taking steps before and during sex to reduce your risk of STIs, giving your partner STIs, and having an unwanted or unplanned pregnancy.  Before having sex with a new partner, talk to your partner about past partners, past STIs, and drug use.  Use a condom every time you have vaginal, oral, or anal sex. Both females and males should wear condoms during oral sex.  Check your body regularly for sores, blisters, rashes, or unusual discharge. If you notice any of these problems, visit your health care provider.  See your health care provider for regular screenings, exams, and tests for STIs. This information is not intended to replace advice given to you by your health care provider. Make sure you discuss any questions you have with your health care provider. Document Released: 12/01/2004 Document Revised: 02/15/2019 Document Reviewed: 08/06/2018   Elsevier Patient Education  2020 [facility] vaginal hygiene practices   -  Avoid sleeper pajamas. Nightgowns allow air to circulate.  Sleep without underpants whenever possible.  -  Wear cotton underpants during the day. Double-rinse underwear after washing to avoid  residual irritants. Do not use fabric softeners for underwear and swimsuits.  - Avoid tights, leotards, leggings, "skinny" jeans, and other tight-fitting clothing. Skirts and loose-fitting pants allow air to circulate.  - Avoid pantyliners.  Instead use tampons or cotton pads.  - Daily warm bathing is helpful:     - Soak in clean water (no soap) for 10 to 15 minutes.     - Use soap to wash regions other than the genital area just before getting out of the tub or drain water and take a shower to wash your body. Limit use of any soap on genital areas. Use   fragance-free soaps.     - Rinse the genital area well and gently pat dry.  Don't rub.  Hair dryer to assist with drying can be used only if on cool setting.     - Do not use bubble baths or perfumed soaps.  - Do not use any feminine sprays, douches or powders.  These contain chemicals that will irritate the skin.  - If the genital area is tender or swollen, cool compresses may relieve the discomfort. Unscented wet wipes can be used instead of toilet paper for wiping.   - Emollients, such as Vaseline, may help protect skin and can be applied to the irritated area.  - Always remember to wipe front-to-back after bowel movements. Pat dry after urination.  - Do not sit in wet swimsuits for long periods of time after swimming

## 2019-05-29 LAB — STD SCREEN (8)
HIV Screen 4th Generation wRfx: NONREACTIVE
HSV 1 Glycoprotein G Ab, IgG: <0.91 index (ref 0.00–0.90)
HSV 2 IgG, Type Spec: <0.91 index (ref 0.00–0.90)
Hep A IgM: NEGATIVE
Hep B C IgM: NEGATIVE
Hep C Virus Ab: <0.1 s/co ratio (ref 0.0–0.9)
Hepatitis B Surface Ag: NEGATIVE
RPR Ser Ql: NONREACTIVE

## 2019-05-30 ENCOUNTER — Ambulatory Visit (INDEPENDENT_AMBULATORY_CARE_PROVIDER_SITE_OTHER): Payer: Medicaid Other | Admitting: Family Medicine

## 2019-05-30 ENCOUNTER — Other Ambulatory Visit: Payer: Self-pay

## 2019-05-30 ENCOUNTER — Encounter: Payer: Self-pay | Admitting: Family Medicine

## 2019-05-30 DIAGNOSIS — H6591 Unspecified nonsuppurative otitis media, right ear: Secondary | ICD-10-CM

## 2019-05-30 MED ORDER — CETIRIZINE HCL 10 MG PO TABS: 10.0000 mg | ORAL_TABLET | Freq: Every day | ORAL | 11 refills | Status: DC

## 2019-05-30 MED ORDER — FLUTICASONE PROPIONATE 50 MCG/ACT NA SUSP: 2.0000 | Freq: Every day | NASAL | 6 refills | Status: DC

## 2019-05-30 NOTE — Progress Notes (Signed)
Virtual Visit via telephone Note Due to COVID-19 pandemic this visit was conducted virtually. This visit type was conducted due to national recommendations for restrictions regarding the COVID-19 Pandemic (e.g. social distancing, sheltering in place) in an effort to limit this patient's exposure and mitigate transmission in our community. All issues noted in this document were discussed and addressed.  A physical exam was not performed with this format.   I connected with Vickie SlackMakayla Raimondo on 05/30/19 at 0900 by telephone and verified that I am speaking with the correct person using two identifiers. Vickie SlackMakayla Sebo is currently located at home and family is currently with them during visit. The provider, Kari BaarsMichelle , FNP is located in their office at time of visit.  I discussed the limitations, risks, security and privacy concerns of performing an evaluation and management service by telephone and the availability of in person appointments. I also discussed with the patient that there may be a patient responsible charge related to this service. The patient expressed understanding and agreed to proceed.  Subjective:  Patient ID: Vickie SlackMakayla Gartner, female    DOB: 12/06/2000, 18 y.o.   MRN: [id number]  Chief Complaint:  Otalgia (right)   HPI: Vickie SlackMakayla Layton is a 18 y.o. female presenting on 05/30/2019 for Otalgia (right)   Pt reports the feeling of fullness in her right ear. States she has a popping sound in her ear. States the ear aches if she tilts to the side. No real pain. No fever, chills, sore throat, cough, drainage, or headache. No adenopathy.   Otalgia  There is pain in the right ear. This is a new problem. The current episode started yesterday. The problem occurs every few minutes. The problem has been waxing and waning. There has been no fever. The pain is at a severity of 0/10. The patient is experiencing no pain. Pertinent negatives include no abdominal pain, coughing, diarrhea, ear discharge,  headaches, hearing loss, neck pain, rash, rhinorrhea, sore throat or vomiting. She has tried nothing for the symptoms.     Relevant past medical, surgical, family, and social history reviewed and updated as indicated.  Allergies and medications reviewed and updated.   Past Medical History:  Diagnosis Date  . ADHD   . Allergy   . Anxiety   . Depression     Past Surgical History:  Procedure Laterality Date  . TONSILLECTOMY AND ADENOIDECTOMY  2006  . TYMPANOSTOMY TUBE PLACEMENT Bilateral 2006    Social History   Socioeconomic History  . Marital status: Single    Spouse name: Not on file  . Number of children: Not on file  . Years of education: Not on file  . Highest education level: Not on file  Occupational History  . 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 Financial resource strain: Not on file  . Food insecurity    Worry: Not on file    Inability: Not on file  . Transportation needs    Medical: Not on file    Non-medical: Not on file  Tobacco Use  . Smoking status: Never Smoker  . Smokeless tobacco: Never Used  Substance and Sexual Activity  . Alcohol use: Not Currently    Comment: occ  . Drug use: Not Currently  . Sexual activity: Yes    Birth control/protection: Condom  Lifestyle  . Physical activity    Days per week: Not on file    Minutes per session: Not on file  . Stress: Not  on file  Relationships  . Social Musicianconnections    Talks on phone: Not on file    Gets together: Not on file    Attends religious service: Not on file    Active member of club or organization: Not on file    Attends meetings of clubs or organizations: Not on file    Relationship status: Not on file  . Intimate partner violence    Fear of current or ex partner: Not on file    Emotionally abused: Not on file    Physically abused: Not on file    Forced sexual activity: Not on file  Other Topics Concern  . Not on file  Social History Narrative    Patient is in her 1st year at [employer] studying [employer].  She has a boyfriend.    Outpatient Encounter Medications as of 05/30/2019  Medication Sig  . cetirizine (ZYRTEC) 10 MG tablet Take 1 tablet (10 mg total) by mouth daily.  Marland Kitchen. FLUoxetine (PROZAC) 40 MG capsule Take 1 capsule (40 mg total) by mouth daily.  . fluticasone (FLONASE) 50 MCG/ACT nasal spray Place 2 sprays into both nostrils daily.  . methylphenidate 27 MG PO CR tablet Take 27 mg by mouth every morning.  . metroNIDAZOLE (FLAGYL) 500 MG tablet Take 1 tablet (500 mg total) by mouth 2 (two) times daily.   No facility-administered encounter medications on file as of 05/30/2019.     No Known Allergies  Review of Systems  Constitutional: Negative for activity change, appetite change, chills, diaphoresis, fatigue, fever and unexpected weight change.  HENT: Positive for ear pain and tinnitus. Negative for congestion, dental problem, drooling, ear discharge, facial swelling, hearing loss, mouth sores, nosebleeds, postnasal drip, rhinorrhea, sinus pressure, sinus pain, sore throat, trouble swallowing and voice change.   Respiratory: Negative for cough and shortness of breath.   Cardiovascular: Negative for chest pain and palpitations.  Gastrointestinal: Negative for abdominal pain, diarrhea and vomiting.  Musculoskeletal: Negative for arthralgias, myalgias and neck pain.  Skin: Negative for rash.  Neurological: Negative for dizziness, light-headedness and headaches.  Hematological: Negative for adenopathy.         Observations/Objective: No vital signs or physical exam, this was a telephone or virtual health encounter.  Pt alert and oriented, answers all questions appropriately, and able to speak in full sentences.    Assessment and Plan: [name] was seen today for otalgia.  Diagnoses and all orders for this visit:  Right otitis media with effusion Reported symptoms consistent with otitis media with effusion. No  symptoms concerning for acute bacterial infection. Will treat symptomatically with below. Pt aware to report any new or worsening symptoms. Pt aware if she develops fever or otalgia, to call office, she may require antibiotics.  -     cetirizine (ZYRTEC) 10 MG tablet; Take 1 tablet (10 mg total) by mouth daily. -     fluticasone (FLONASE) 50 MCG/ACT nasal spray; Place 2 sprays into both nostrils daily.     Follow Up Instructions: Return if symptoms worsen or fail to improve.    I discussed the assessment and treatment plan with the patient. The patient was provided an opportunity to ask questions and all were answered. The patient agreed with the plan and demonstrated an understanding of the instructions.   The patient was advised to call back or seek an in-person evaluation if the symptoms worsen or if the condition fails to improve as anticipated.  The above assessment and management plan was  discussed with the patient. The patient verbalized understanding of and has agreed to the management plan. Patient is aware to call the clinic if symptoms persist or worsen. Patient is aware when to return to the clinic for a follow-up visit. Patient educated on when it is appropriate to go to the emergency department.    I provided 15 minutes of non-face-to-face time during this encounter. The call started at 0900. The call ended at 0915. The other time was used for coordination of care.    Monia Pouch, FNP-C [facility] [address] [place], [place] [address] [phone] 05/30/19

## 2019-05-31 ENCOUNTER — Telehealth: Payer: Self-pay | Admitting: Family Medicine

## 2019-05-31 NOTE — Telephone Encounter (Signed)
Awaiting last STI result. Will call pt when all tests have resulted.

## 2019-05-31 NOTE — Telephone Encounter (Signed)
Vickie Dillon saw her on 05/28/19 and ordered labs, they are in her in basket, one test pending

## 2019-06-03 DIAGNOSIS — F331 Major depressive disorder, recurrent, moderate: Secondary | ICD-10-CM | POA: Diagnosis not present

## 2019-06-03 DIAGNOSIS — F902 Attention-deficit hyperactivity disorder, combined type: Secondary | ICD-10-CM | POA: Diagnosis not present

## 2019-06-03 DIAGNOSIS — F411 Generalized anxiety disorder: Secondary | ICD-10-CM | POA: Diagnosis not present

## 2019-06-03 DIAGNOSIS — Z79899 Other long term (current) drug therapy: Secondary | ICD-10-CM | POA: Diagnosis not present

## 2019-06-06 LAB — CHLAMYDIA/GONOCOCCUS/TRICHOMONAS, NAA
Chlamydia by NAA: POSITIVE — AB
Gonococcus by NAA: NEGATIVE
Trich vag by NAA: NEGATIVE

## 2019-06-26 DIAGNOSIS — M7989 Other specified soft tissue disorders: Secondary | ICD-10-CM | POA: Diagnosis not present

## 2019-06-26 DIAGNOSIS — M25472 Effusion, left ankle: Secondary | ICD-10-CM | POA: Diagnosis not present

## 2019-06-26 DIAGNOSIS — S93402A Sprain of unspecified ligament of left ankle, initial encounter: Secondary | ICD-10-CM | POA: Diagnosis not present

## 2019-07-01 DIAGNOSIS — F331 Major depressive disorder, recurrent, moderate: Secondary | ICD-10-CM | POA: Diagnosis not present

## 2019-07-01 DIAGNOSIS — F902 Attention-deficit hyperactivity disorder, combined type: Secondary | ICD-10-CM | POA: Diagnosis not present

## 2019-07-01 DIAGNOSIS — F411 Generalized anxiety disorder: Secondary | ICD-10-CM | POA: Diagnosis not present

## 2019-07-23 DIAGNOSIS — R1031 Right lower quadrant pain: Secondary | ICD-10-CM | POA: Diagnosis not present

## 2019-07-23 DIAGNOSIS — Z79899 Other long term (current) drug therapy: Secondary | ICD-10-CM | POA: Diagnosis not present

## 2019-07-23 DIAGNOSIS — F988 Other specified behavioral and emotional disorders with onset usually occurring in childhood and adolescence: Secondary | ICD-10-CM | POA: Diagnosis not present

## 2019-07-23 DIAGNOSIS — F329 Major depressive disorder, single episode, unspecified: Secondary | ICD-10-CM | POA: Diagnosis not present

## 2019-07-23 DIAGNOSIS — R102 Pelvic and perineal pain: Secondary | ICD-10-CM | POA: Diagnosis not present

## 2019-07-28 DIAGNOSIS — S6982XA Other specified injuries of left wrist, hand and finger(s), initial encounter: Secondary | ICD-10-CM | POA: Diagnosis not present

## 2019-07-28 DIAGNOSIS — S61231A Puncture wound without foreign body of left index finger without damage to nail, initial encounter: Secondary | ICD-10-CM | POA: Diagnosis not present

## 2019-07-28 DIAGNOSIS — W460XXA Contact with hypodermic needle, initial encounter: Secondary | ICD-10-CM | POA: Diagnosis not present

## 2019-08-09 ENCOUNTER — Other Ambulatory Visit: Payer: Self-pay

## 2019-08-09 ENCOUNTER — Ambulatory Visit (INDEPENDENT_AMBULATORY_CARE_PROVIDER_SITE_OTHER): Payer: Medicaid Other | Admitting: Nurse Practitioner

## 2019-08-09 ENCOUNTER — Encounter: Payer: Self-pay | Admitting: Nurse Practitioner

## 2019-08-09 VITALS — BP 109/66 | HR 68 | Temp 97.3°F | Resp 16 | Ht 60.0 in | Wt 232.0 lb

## 2019-08-09 DIAGNOSIS — L02229 Furuncle of trunk, unspecified: Secondary | ICD-10-CM

## 2019-08-09 MED ORDER — CEPHALEXIN 500 MG PO CAPS: 500.0000 mg | ORAL_CAPSULE | Freq: Three times a day (TID) | ORAL | 0 refills | Status: DC

## 2019-08-09 NOTE — Progress Notes (Signed)
   Subjective:    Patient ID: Vickie Dillon, female    DOB: 2001-09-11, 18 y.o.   MRN: [id number]   Chief Complaint: Possible ingrown hair   HPI Patient comes in c/o bumop in her privat e area that is sore to touch. She noticed it yesterday morning. She denies any drainage.   Review of Systems  Constitutional: Negative for activity change and appetite change.  HENT: Negative.   Eyes: Negative for pain.  Respiratory: Negative for shortness of breath.   Cardiovascular: Negative for chest pain, palpitations and leg swelling.  Gastrointestinal: Negative for abdominal pain.  Endocrine: Negative for polydipsia.  Genitourinary: Negative.   Skin: Negative for rash.  Neurological: Negative for dizziness, weakness and headaches.  Hematological: Does not bruise/bleed easily.  Psychiatric/Behavioral: Negative.   All other systems reviewed and are negative.      Objective:   Physical Exam Vitals signs and nursing note reviewed.  Constitutional:      Appearance: Normal appearance.  Cardiovascular:     Rate and Rhythm: Normal rate and regular rhythm.     Heart sounds: Normal heart sounds.  Pulmonary:     Effort: Pulmonary effort is normal.     Breath sounds: Normal breath sounds.  Genitourinary:    Comments: 1cm raised fluctuant lesion just above clitoral area in midline of pubis- slight erythema and tender to touch- no drainage. Skin:    General: Skin is warm and dry.  Neurological:     General: No focal deficit present.     Mental Status: She is alert and oriented to person, place, and time.     BP 109/66   Pulse 68   Temp (!) 97.3 F (36.3 C) (Oral)   Resp 16   Ht 5' (1.524 m)   Wt 232 lb (105.2 kg)   SpO2 96%   BMI 45.31 kg/m        Assessment & Plan:  Vickie Dillon in today with chief complaint of Possible ingrown hair   1. Furuncle of pubic region Meds ordered this encounter  Medications  . cephALEXin (KEFLEX) 500 MG capsule    Sig: Take 1 capsule (500 mg  total) by mouth 3 (three) times daily.    Dispense:  21 capsule    Refill:  0    Order Specific Question:   Supervising Provider    Answer:   Caryl Pina A [[id number]]   Do not pick or scratch at area. Warm compresses BID RTO prn  Mary-Margaret Hassell Done, FNP

## 2019-08-09 NOTE — Patient Instructions (Signed)

## 2019-10-20 DIAGNOSIS — Z3200 Encounter for pregnancy test, result unknown: Secondary | ICD-10-CM | POA: Diagnosis not present

## 2019-10-20 DIAGNOSIS — R112 Nausea with vomiting, unspecified: Secondary | ICD-10-CM | POA: Diagnosis not present

## 2019-10-22 ENCOUNTER — Ambulatory Visit (INDEPENDENT_AMBULATORY_CARE_PROVIDER_SITE_OTHER): Payer: Medicaid Other | Admitting: Family Medicine

## 2019-10-22 ENCOUNTER — Encounter: Payer: Self-pay | Admitting: Family Medicine

## 2019-10-22 DIAGNOSIS — R1110 Vomiting, unspecified: Secondary | ICD-10-CM

## 2019-10-22 NOTE — Progress Notes (Signed)
Virtual Visit via Telephone Note  I connected with Vickie Dillon on 10/22/19 at 9:27 AM by telephone and verified that I am speaking with the correct person using two identifiers. Vickie Dillon is currently located at home and her mother is currently with her during this visit. The provider, Loman Brooklyn, FNP is located in their office at time of visit.  I discussed the limitations, risks, security and privacy concerns of performing an evaluation and management service by telephone and the availability of in person appointments. I also discussed with the patient that there may be a patient responsible charge related to this service. The patient expressed understanding and agreed to proceed.  Subjective: PCP: Janora Norlander, DO  Chief Complaint  Patient presents with  . Emesis   Patient reports every morning for the past 5 days she is awoken from her sleep due to lower abdominal pain.  She gets up and goes to the bathroom sits for about 20 minutes and then throws up what looks like mucus.  After she gets that up she is fine for the rest of the day.  She does experience nausea before she throws up, but denies any diarrhea.  Also denies any heartburn, indigestion, or acid reflux.  Patient was seen in urgent care 2 days ago at which time she had a negative urine pregnancy test.  She is sexually active and reports her last "normal" period was about a month ago.  She did have very light bleeding for 1 day last week.  She reports her cycles are usually very regular occurring every 32 days and lasting for 4 days.   ROS: Per HPI  Current Outpatient Medications:  .  FLUoxetine (PROZAC) 40 MG capsule, Take 1 capsule (40 mg total) by mouth daily., Disp: 30 capsule, Rfl: 5 .  methylphenidate 27 MG PO CR tablet, Take 27 mg by mouth every morning., Disp: , Rfl:   No Known Allergies Past Medical History:  Diagnosis Date  . ADHD   . Allergy   . Anxiety   . Depression      Observations/Objective: A&O  No respiratory distress or wheezing audible over the phone Mood, judgement, and thought processes all WNL  Assessment and Plan: 1. Morning vomiting - Advised patient to take a another pregnancy test in 1 to 2 days.  Symptoms not consistent with a viral illness, patient denies any type of acid reflux, indigestion, or heartburn.  She has no respiratory symptoms.  Does not seem anxiety related especially since it only happens in the mornings and it wakes her up from sleep.  She will call back if urine pregnancy test is negative and her symptoms worsen or do not improve.   Follow Up Instructions:  I discussed the assessment and treatment plan with the patient. The patient was provided an opportunity to ask questions and all were answered. The patient agreed with the plan and demonstrated an understanding of the instructions.   The patient was advised to call back or seek an in-person evaluation if the symptoms worsen or if the condition fails to improve as anticipated.  The above assessment and management plan was discussed with the patient. The patient verbalized understanding of and has agreed to the management plan. Patient is aware to call the clinic if symptoms persist or worsen. Patient is aware when to return to the clinic for a follow-up visit. Patient educated on when it is appropriate to go to the emergency department.   Time call ended:  9:37 AM  I provided 13 minutes of non-face-to-face time during this encounter.  Deliah Boston, MSN, APRN, FNP-C [facility] 10/22/19

## 2019-11-25 DIAGNOSIS — Z79899 Other long term (current) drug therapy: Secondary | ICD-10-CM | POA: Diagnosis not present

## 2019-11-25 DIAGNOSIS — F902 Attention-deficit hyperactivity disorder, combined type: Secondary | ICD-10-CM | POA: Diagnosis not present

## 2019-11-25 DIAGNOSIS — F411 Generalized anxiety disorder: Secondary | ICD-10-CM | POA: Diagnosis not present

## 2019-11-25 DIAGNOSIS — F331 Major depressive disorder, recurrent, moderate: Secondary | ICD-10-CM | POA: Diagnosis not present

## 2019-12-05 DIAGNOSIS — R110 Nausea: Secondary | ICD-10-CM | POA: Diagnosis not present

## 2020-01-15 ENCOUNTER — Other Ambulatory Visit: Payer: Self-pay

## 2020-01-15 ENCOUNTER — Ambulatory Visit (INDEPENDENT_AMBULATORY_CARE_PROVIDER_SITE_OTHER): Payer: Medicaid Other | Admitting: Family Medicine

## 2020-01-15 ENCOUNTER — Encounter: Payer: Self-pay | Admitting: Family Medicine

## 2020-01-15 VITALS — BP 105/67 | HR 64 | Temp 97.9°F | Ht 63.0 in | Wt 209.0 lb

## 2020-01-15 DIAGNOSIS — F329 Major depressive disorder, single episode, unspecified: Secondary | ICD-10-CM | POA: Diagnosis not present

## 2020-01-15 DIAGNOSIS — N760 Acute vaginitis: Secondary | ICD-10-CM | POA: Diagnosis not present

## 2020-01-15 DIAGNOSIS — F32A Depression, unspecified: Secondary | ICD-10-CM

## 2020-01-15 DIAGNOSIS — Z7251 High risk heterosexual behavior: Secondary | ICD-10-CM | POA: Diagnosis not present

## 2020-01-15 DIAGNOSIS — F411 Generalized anxiety disorder: Secondary | ICD-10-CM | POA: Diagnosis not present

## 2020-01-15 DIAGNOSIS — B9689 Other specified bacterial agents as the cause of diseases classified elsewhere: Secondary | ICD-10-CM | POA: Diagnosis not present

## 2020-01-15 LAB — WET PREP FOR TRICH, YEAST, CLUE
Clue Cell Exam: POSITIVE — AB
Trichomonas Exam: NEGATIVE
Yeast Exam: NEGATIVE

## 2020-01-15 MED ORDER — METRONIDAZOLE 500 MG PO TABS: 500.0000 mg | ORAL_TABLET | Freq: Two times a day (BID) | ORAL | 0 refills | Status: DC

## 2020-01-15 MED ORDER — FLUOXETINE HCL 40 MG PO CAPS: 40.0000 mg | ORAL_CAPSULE | Freq: Every day | ORAL | 5 refills | Status: DC

## 2020-01-15 NOTE — Progress Notes (Signed)
Subjective: CC: STD screen PCP: Raliegh Ip, DO Vickie Dillon is a 19 y.o. female presenting to clinic today for:  1.  STD screen Patient reports that she has just broken up with a boyfriend who was unfaithful.  Her last intercourse was greater than 1 month ago with him.  This was unprotected.  Her last menstrual cycle was 12/25/2019.  No new partners.  She denies any vaginal symptoms including pelvic pain, vaginal burning, lesions, discharge, irregular vaginal bleeding.   Patient's last menstrual period was 12/25/2019.  2.  Anxiety and depression Patient reports good control of anxiety and depression with fluoxetine 40 mg daily.  She does need a refill on this as she has not been able to contact her psychiatrist, Dr. Lloyd Huger, in [place].  She is also treated with Concerta which she also needs refills on but understands that this has to be renewed by her prescribing provider.  She will try and get contact with him again.   ROS: Per HPI  No Known Allergies Past Medical History:  Diagnosis Date  . ADHD   . Allergy   . Anxiety   . Depression     Current Outpatient Medications:  .  FLUoxetine (PROZAC) 40 MG capsule, Take 1 capsule (40 mg total) by mouth daily., Disp: 30 capsule, Rfl: 5 .  methylphenidate 27 MG PO CR tablet, Take 27 mg by mouth every morning., Disp: , Rfl:  Social History   Socioeconomic History  . Marital status: Single    Spouse name: Not on file  . Number of children: Not on file  . Years of education: Not on file  . Highest education level: Not on file  Occupational History  . 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 Smoking status: Never Smoker  . Smokeless tobacco: Never Used  Substance and Sexual Activity  . Alcohol use: Not Currently    Comment: occ  . Drug use: Not Currently  . Sexual activity: Yes    Birth control/protection: Condom  Other Topics Concern  . Not on file  Social History Narrative   Patient is in her 1st year at [employer] studying psychology.  She has a boyfriend.   Social Determinants of Health   Financial Resource Strain:   . Difficulty of Paying Living Expenses: Not on file  Food Insecurity:   . Worried About Programme researcher, broadcasting/film/video in the Last Year: Not on file  . Ran Out of Food in the Last Year: Not on file  Transportation Needs:   . Lack of Transportation (Medical): Not on file  . Lack of Transportation (Non-Medical): Not on file  Physical Activity:   . Days of Exercise per Week: Not on file  . Minutes of Exercise per Session: Not on file  Stress:   . Feeling of Stress : Not on file  Social Connections:   . Frequency of Communication with Friends and Family: Not on file  . Frequency of Social Gatherings with Friends and Family: Not on file  . Attends Religious Services: Not on file  . Active Member of Clubs or Organizations: Not on file  . Attends Banker Meetings: Not on file  . Marital Status: Not on file  Intimate Partner Violence:   . Fear of Current or Ex-Partner: Not on file  . Emotionally Abused: Not on file  . Physically Abused: Not on file  . Sexually Abused: Not on file   Family History  Problem Relation Age of Onset  . Hypertension Mother   . Hypothyroidism Mother   . Depression Mother   . Anxiety disorder Mother   . Bipolar disorder Mother   . Hyperlipidemia Mother   . Migraines Mother   . Multiple sclerosis Father   . Depression Father   . Bipolar disorder Father   . Hypertension Father   . Anxiety disorder Maternal Grandmother   . Hyperlipidemia Maternal Grandfather   . Hypertension Maternal Grandfather   . Nephrolithiasis Maternal Grandfather   . Diabetes Paternal Grandmother   . Diabetes Paternal Grandfather   . Heart attack Paternal Grandfather   . Anxiety disorder Half-Brother   . Migraines Half-Sister   . Bipolar disorder Half-Sister   . Anxiety disorder Half-Sister     Objective: Office vital signs  reviewed. BP 105/67   Pulse 64   Temp 97.9 F (36.6 C) (Temporal)   Ht 5\' 3"  (1.6 m)   Wt 209 lb (94.8 kg)   LMP 12/25/2019   SpO2 97%   BMI 37.02 kg/m   Physical Examination:  General: Awake, alert, well appearing, No acute distress HEENT: Normal, sclera white, MMM Cardio: regular rate and rhythm, S1S2 heard, no murmurs appreciated Pulm: clear to auscultation bilaterally, no wheezes, rhonchi or rales; normal work of breathing on room air GU: external vaginal tissue normal, cervix retroverted but midline, no punctate lesions on cervix appreciated, moderate white milky discharge from cervical os, no bleeding, no cervical motion tenderness, no abdominal/ adnexal masses Psych: Mood stable, speech normal, affect appropriate, pleasant and interactive  Depression screen [facility] 2/9 01/15/2020 08/09/2019 05/28/2019  Decreased Interest 0 0 2  Down, Depressed, Hopeless 1 0 2  PHQ - 2 Score 1 0 4  Altered sleeping 1 - 0  Tired, decreased energy 2 - 2  Change in appetite 3 - 0  Feeling bad or failure about yourself  3 - 2  Trouble concentrating 0 - 1  Moving slowly or fidgety/restless 0 - 0  Suicidal thoughts 0 - 1  PHQ-9 Score 10 - 10  Difficult doing work/chores Somewhat difficult - Extremely dIfficult   Assessment/ Plan: 19 y.o. female   1. 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  Her wet prep did come back with clue cells.  We will plan to send in Flagyl.  Reinforced safe sex practices. - GC/Chlamydia Probe Amp - STD Screen (8) - WET PREP FOR TRICH, YEAST, CLUE  2. Depressive disorder Her PHQ is relatively unchanged from July of last year.  However, subjectively she is feeling better on the fluoxetine and therefore extends renewals.  I encouraged her to follow-up with her psychiatrist for ongoing management.  She was given understanding will contact them again - FLUoxetine (PROZAC) 40 MG capsule; Take 1 capsule (40 mg total) by mouth daily.  Dispense: 30 capsule; Refill:  5  3. Generalized anxiety disorder - FLUoxetine (PROZAC) 40 MG capsule; Take 1 capsule (40 mg total) by mouth daily.  Dispense: 30 capsule; Refill: 5  4. Bacterial vaginosis  Orders Placed This Encounter  Procedures  . GC/Chlamydia Probe Amp  . WET PREP FOR [place], YEAST, CLUE  . STD Screen (8)   Meds ordered this encounter  Medications  . FLUoxetine (PROZAC) 40 MG capsule    Sig: Take 1 capsule (40 mg total) by mouth daily.    Dispense:  30 capsule    Refill:  5  . metroNIDAZOLE (FLAGYL) 500 MG tablet    Sig: Take 1 tablet (500  mg total) by mouth 2 (two) times daily.    Dispense:  14 tablet    Refill:  0     Lyncoln Ledgerwood Hulen Skains, DO [facility] [phone]

## 2020-01-15 NOTE — Addendum Note (Signed)
Addended byDory Peru on: 01/15/2020 02:22 PM   Modules accepted: Orders

## 2020-01-15 NOTE — Patient Instructions (Addendum)
Your concerta needs to be filled by your specialist, Dr Lloyd Huger because it is a controlled substance. I have renewed your Prozac.  You had labs performed today.  You will be contacted with the results of the labs once they are available, usually in the next 3 business days for routine lab work.  If you have an active my chart account, they will be released to your MyChart.  If you prefer to have these labs released to you via telephone, please let us know.

## 2020-01-16 LAB — STD SCREEN (8)
HIV Screen 4th Generation wRfx: NONREACTIVE
HSV 1 Glycoprotein G Ab, IgG: <0.91 index (ref 0.00–0.90)
HSV 2 IgG, Type Spec: <0.91 index (ref 0.00–0.90)
Hep A IgM: NEGATIVE
Hep B C IgM: NEGATIVE
Hep C Virus Ab: <0.1 s/co ratio (ref 0.0–0.9)
Hepatitis B Surface Ag: NEGATIVE
RPR Ser Ql: NONREACTIVE

## 2020-01-18 LAB — CT/NG NAA RFX TV NAA
Chlamydia by NAA: NEGATIVE
Gonococcus by NAA: NEGATIVE

## 2020-01-23 ENCOUNTER — Telehealth: Payer: Self-pay | Admitting: Family Medicine

## 2020-01-23 NOTE — Telephone Encounter (Signed)
Pt informed of negative labs

## 2020-02-23 DIAGNOSIS — Z03818 Encounter for observation for suspected exposure to other biological agents ruled out: Secondary | ICD-10-CM | POA: Diagnosis not present

## 2020-02-23 DIAGNOSIS — U071 COVID-19: Secondary | ICD-10-CM | POA: Diagnosis not present

## 2020-02-23 DIAGNOSIS — Z20828 Contact with and (suspected) exposure to other viral communicable diseases: Secondary | ICD-10-CM | POA: Diagnosis not present

## 2020-03-02 DIAGNOSIS — F902 Attention-deficit hyperactivity disorder, combined type: Secondary | ICD-10-CM | POA: Diagnosis not present

## 2020-03-02 DIAGNOSIS — F411 Generalized anxiety disorder: Secondary | ICD-10-CM | POA: Diagnosis not present

## 2020-03-02 DIAGNOSIS — F331 Major depressive disorder, recurrent, moderate: Secondary | ICD-10-CM | POA: Diagnosis not present

## 2020-03-14 DIAGNOSIS — Z3200 Encounter for pregnancy test, result unknown: Secondary | ICD-10-CM | POA: Diagnosis not present

## 2020-03-16 ENCOUNTER — Telehealth: Payer: Self-pay | Admitting: Family Medicine

## 2020-03-16 DIAGNOSIS — F411 Generalized anxiety disorder: Secondary | ICD-10-CM | POA: Diagnosis not present

## 2020-03-16 DIAGNOSIS — F331 Major depressive disorder, recurrent, moderate: Secondary | ICD-10-CM | POA: Diagnosis not present

## 2020-03-16 DIAGNOSIS — F902 Attention-deficit hyperactivity disorder, combined type: Secondary | ICD-10-CM | POA: Diagnosis not present

## 2020-03-16 NOTE — Telephone Encounter (Signed)
Patient is aware Dr. Reece Agar is out of the office this week.  Please advise and send back to pools.

## 2020-03-16 NOTE — Telephone Encounter (Signed)
  REFERRAL REQUEST Telephone Note 03/16/2020  What type of referral do you need? Patient is three weeks pregnant and needs a referral to OBGYN  Have you been seen at our office for this problem? NO (Advise that they may need an appointment with their PCP before a referral can be done)  Is there a particular doctor or location that you prefer? [facility]   Patient notified that referrals can take up to a week or longer to process. If they haven't heard anything within a week they should call back and speak with the referral department.

## 2020-03-19 NOTE — Telephone Encounter (Signed)
Called patient, no answer, left message to return call 

## 2020-03-19 NOTE — Telephone Encounter (Signed)
Pt needs to be seen for pregnancy test and we can place referral.

## 2020-03-19 NOTE — Telephone Encounter (Signed)
Patient aware, states she already has appointment with OBGYN

## 2020-04-04 DIAGNOSIS — O26892 Other specified pregnancy related conditions, second trimester: Secondary | ICD-10-CM | POA: Diagnosis not present

## 2020-04-04 DIAGNOSIS — R1012 Left upper quadrant pain: Secondary | ICD-10-CM | POA: Diagnosis not present

## 2020-04-04 DIAGNOSIS — O99891 Other specified diseases and conditions complicating pregnancy: Secondary | ICD-10-CM | POA: Diagnosis not present

## 2020-04-04 DIAGNOSIS — Z87891 Personal history of nicotine dependence: Secondary | ICD-10-CM | POA: Diagnosis not present

## 2020-04-04 DIAGNOSIS — O208 Other hemorrhage in early pregnancy: Secondary | ICD-10-CM | POA: Diagnosis not present

## 2020-04-04 DIAGNOSIS — Z3A01 Less than 8 weeks gestation of pregnancy: Secondary | ICD-10-CM | POA: Diagnosis not present

## 2020-04-04 DIAGNOSIS — R1032 Left lower quadrant pain: Secondary | ICD-10-CM | POA: Diagnosis not present

## 2020-04-04 DIAGNOSIS — O200 Threatened abortion: Secondary | ICD-10-CM | POA: Diagnosis not present

## 2020-04-07 ENCOUNTER — Telehealth: Payer: Self-pay | Admitting: Family Medicine

## 2020-04-07 NOTE — Telephone Encounter (Signed)
Pt called stating that she is pregnant and was recently seen elsewhere to have her baby looked at and was told that the sack next to her baby was bleeding but that her baby was fine, but that she should schedule an appt to see her PCP. Pt mentioned that she has an Web designer. Scheduled pt to see Christy on 6/10. Should pt see OBGYN instead?

## 2020-04-07 NOTE — Telephone Encounter (Signed)
Patient aware to follow up with OBGYN

## 2020-04-10 DIAGNOSIS — Z3481 Encounter for supervision of other normal pregnancy, first trimester: Secondary | ICD-10-CM | POA: Diagnosis not present

## 2020-04-15 DIAGNOSIS — O99321 Drug use complicating pregnancy, first trimester: Secondary | ICD-10-CM | POA: Diagnosis not present

## 2020-04-15 DIAGNOSIS — O3680X1 Pregnancy with inconclusive fetal viability, fetus 1: Secondary | ICD-10-CM | POA: Diagnosis not present

## 2020-04-16 ENCOUNTER — Ambulatory Visit: Payer: Medicaid Other | Admitting: Family

## 2020-04-17 ENCOUNTER — Encounter: Payer: Self-pay | Admitting: Family Medicine

## 2020-04-20 DIAGNOSIS — F411 Generalized anxiety disorder: Secondary | ICD-10-CM | POA: Diagnosis not present

## 2020-04-20 DIAGNOSIS — F331 Major depressive disorder, recurrent, moderate: Secondary | ICD-10-CM | POA: Diagnosis not present

## 2020-04-20 DIAGNOSIS — F902 Attention-deficit hyperactivity disorder, combined type: Secondary | ICD-10-CM | POA: Diagnosis not present

## 2020-04-29 DIAGNOSIS — Z113 Encounter for screening for infections with a predominantly sexual mode of transmission: Secondary | ICD-10-CM | POA: Diagnosis not present

## 2020-04-29 DIAGNOSIS — Z118 Encounter for screening for other infectious and parasitic diseases: Secondary | ICD-10-CM | POA: Diagnosis not present

## 2020-04-29 DIAGNOSIS — Z3401 Encounter for supervision of normal first pregnancy, first trimester: Secondary | ICD-10-CM | POA: Diagnosis not present

## 2020-05-05 DIAGNOSIS — F411 Generalized anxiety disorder: Secondary | ICD-10-CM | POA: Diagnosis not present

## 2020-05-05 DIAGNOSIS — F331 Major depressive disorder, recurrent, moderate: Secondary | ICD-10-CM | POA: Diagnosis not present

## 2020-05-05 DIAGNOSIS — F902 Attention-deficit hyperactivity disorder, combined type: Secondary | ICD-10-CM | POA: Diagnosis not present

## 2020-05-07 DIAGNOSIS — Z419 Encounter for procedure for purposes other than remedying health state, unspecified: Secondary | ICD-10-CM | POA: Diagnosis not present

## 2020-05-20 DIAGNOSIS — Z36 Encounter for antenatal screening for chromosomal anomalies: Secondary | ICD-10-CM | POA: Diagnosis not present

## 2020-05-20 DIAGNOSIS — Z3682 Encounter for antenatal screening for nuchal translucency: Secondary | ICD-10-CM | POA: Diagnosis not present

## 2020-05-20 DIAGNOSIS — Z3401 Encounter for supervision of normal first pregnancy, first trimester: Secondary | ICD-10-CM | POA: Diagnosis not present

## 2020-05-25 DIAGNOSIS — M549 Dorsalgia, unspecified: Secondary | ICD-10-CM | POA: Diagnosis not present

## 2020-05-25 DIAGNOSIS — F988 Other specified behavioral and emotional disorders with onset usually occurring in childhood and adolescence: Secondary | ICD-10-CM | POA: Diagnosis not present

## 2020-05-25 DIAGNOSIS — O9A211 Injury, poisoning and certain other consequences of external causes complicating pregnancy, first trimester: Secondary | ICD-10-CM | POA: Diagnosis not present

## 2020-05-25 DIAGNOSIS — F329 Major depressive disorder, single episode, unspecified: Secondary | ICD-10-CM | POA: Diagnosis not present

## 2020-05-25 DIAGNOSIS — F419 Anxiety disorder, unspecified: Secondary | ICD-10-CM | POA: Diagnosis not present

## 2020-05-25 DIAGNOSIS — Z79899 Other long term (current) drug therapy: Secondary | ICD-10-CM | POA: Diagnosis not present

## 2020-05-25 DIAGNOSIS — Z3A13 13 weeks gestation of pregnancy: Secondary | ICD-10-CM | POA: Diagnosis not present

## 2020-05-25 DIAGNOSIS — M79651 Pain in right thigh: Secondary | ICD-10-CM | POA: Diagnosis not present

## 2020-05-25 DIAGNOSIS — R109 Unspecified abdominal pain: Secondary | ICD-10-CM | POA: Diagnosis not present

## 2020-05-25 DIAGNOSIS — O99341 Other mental disorders complicating pregnancy, first trimester: Secondary | ICD-10-CM | POA: Diagnosis not present

## 2020-06-03 DIAGNOSIS — R102 Pelvic and perineal pain: Secondary | ICD-10-CM | POA: Diagnosis not present

## 2020-06-03 DIAGNOSIS — Z3A15 15 weeks gestation of pregnancy: Secondary | ICD-10-CM | POA: Diagnosis not present

## 2020-06-07 DIAGNOSIS — Z419 Encounter for procedure for purposes other than remedying health state, unspecified: Secondary | ICD-10-CM | POA: Diagnosis not present

## 2020-06-12 DIAGNOSIS — Z3A16 16 weeks gestation of pregnancy: Secondary | ICD-10-CM | POA: Diagnosis not present

## 2020-06-12 DIAGNOSIS — O352XX0 Maternal care for (suspected) hereditary disease in fetus, not applicable or unspecified: Secondary | ICD-10-CM | POA: Diagnosis not present

## 2020-06-17 DIAGNOSIS — Z363 Encounter for antenatal screening for malformations: Secondary | ICD-10-CM | POA: Diagnosis not present

## 2020-06-18 DIAGNOSIS — Z113 Encounter for screening for infections with a predominantly sexual mode of transmission: Secondary | ICD-10-CM | POA: Diagnosis not present

## 2020-06-18 DIAGNOSIS — R8271 Bacteriuria: Secondary | ICD-10-CM | POA: Diagnosis not present

## 2020-06-25 ENCOUNTER — Ambulatory Visit (INDEPENDENT_AMBULATORY_CARE_PROVIDER_SITE_OTHER): Payer: Medicaid Other | Admitting: Nurse Practitioner

## 2020-06-25 ENCOUNTER — Encounter: Payer: Self-pay | Admitting: Nurse Practitioner

## 2020-06-25 ENCOUNTER — Other Ambulatory Visit: Payer: Self-pay

## 2020-06-25 VITALS — BP 117/71 | HR 131 | Temp 97.7°F | Ht 63.0 in | Wt 212.0 lb

## 2020-06-25 DIAGNOSIS — H9202 Otalgia, left ear: Secondary | ICD-10-CM

## 2020-06-25 MED ORDER — FLUTICASONE PROPIONATE 50 MCG/ACT NA SUSP: 1.0000 | Freq: Every day | NASAL | 2 refills | Status: DC

## 2020-06-25 MED ORDER — ACETAMINOPHEN 500 MG PO TABS: 500.0000 mg | ORAL_TABLET | Freq: Four times a day (QID) | ORAL | 0 refills | Status: DC | PRN

## 2020-06-25 NOTE — Patient Instructions (Signed)

## 2020-06-25 NOTE — Progress Notes (Signed)
Acute Office Visit  Subjective:    Patient ID: Vickie Dillon, female    DOB: June 07, 2001, 19 y.o.   MRN: [id number]  Chief Complaint  Patient presents with  . Ear Pain    left. x 5 days    Otalgia  There is pain in the left ear. This is a new problem. Episode onset: in the past 5 days. The problem has been gradually improving. There has been no fever. The pain is at a severity of 0/10. Pertinent negatives include no ear discharge. She has tried nothing for the symptoms. There is no history of a chronic ear infection or hearing loss.    Past Medical History:  Diagnosis Date  . ADHD   . Allergy   . Anxiety   . Depression     Past Surgical History:  Procedure Laterality Date  . TONSILLECTOMY AND ADENOIDECTOMY  2006  . TYMPANOSTOMY TUBE PLACEMENT Bilateral 2006    Family History  Problem Relation Age of Onset  . Hypertension Mother   . Hypothyroidism Mother   . Depression Mother   . Anxiety disorder Mother   . Bipolar disorder Mother   . Hyperlipidemia Mother   . Migraines Mother   . Multiple sclerosis Father   . Depression Father   . Bipolar disorder Father   . Hypertension Father   . Anxiety disorder Maternal Grandmother   . Hyperlipidemia Maternal Grandfather   . Hypertension Maternal Grandfather   . Nephrolithiasis Maternal Grandfather   . Diabetes Paternal Grandmother   . Diabetes Paternal Grandfather   . Heart attack Paternal Grandfather   . Anxiety disorder Half-Brother   . Migraines Half-Sister   . Bipolar disorder Half-Sister   . Anxiety disorder Half-Sister     Social History   Socioeconomic History  . Marital status: Single    Spouse name: Not on file  . Number of children: Not on file  . Years of education: Not on file  . Highest education level: Not on file  Occupational History  . 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 Smoking status: Never Smoker  . Smokeless tobacco: Never Used  Vaping Use  . Vaping Use: Some  days  Substance and Sexual Activity  . Alcohol use: Not Currently    Comment: occ  . Drug use: Not Currently  . Sexual activity: Yes    Birth control/protection: Condom  Other Topics Concern  . Not on file  Social History Narrative   Patient is in her 1st year at [employer].  She has a boyfriend.   Social Determinants of Health   Financial Resource Strain:   . Difficulty of Paying Living Expenses: Not on file  Food Insecurity:   . Worried About Charity fundraiser in the Last Year: Not on file  . Ran Out of Food in the Last Year: Not on file  Transportation Needs:   . Lack of Transportation (Medical): Not on file  . Lack of Transportation (Non-Medical): Not on file  Physical Activity:   . Days of Exercise per Week: Not on file  . Minutes of Exercise per Session: Not on file  Stress:   . Feeling of Stress : Not on file  Social Connections:   . Frequency of Communication with Friends and Family: Not on file  . Frequency of Social Gatherings with Friends and Family: Not on file  . Attends Religious Services: Not on file  . Active Member of  Clubs or Organizations: Not on file  . Attends Archivist Meetings: Not on file  . Marital Status: Not on file  Intimate Partner Violence:   . Fear of Current or Ex-Partner: Not on file  . Emotionally Abused: Not on file  . Physically Abused: Not on file  . Sexually Abused: Not on file    Outpatient Medications Prior to Visit  Medication Sig Dispense Refill  . FLUoxetine (PROZAC) 40 MG capsule Take 1 capsule (40 mg total) by mouth daily. 30 capsule 5  . methylphenidate 27 MG PO CR tablet Take 27 mg by mouth every morning.    . metroNIDAZOLE (FLAGYL) 500 MG tablet Take 1 tablet (500 mg total) by mouth 2 (two) times daily. 14 tablet 0   No facility-administered medications prior to visit.    No Known Allergies  Review of Systems  Constitutional: Negative.   HENT: Positive for ear pain. Negative for  ear discharge, sinus pressure and sinus pain.   Eyes: Negative.   Respiratory: Negative.   Cardiovascular: Negative.   Gastrointestinal: Negative.   Genitourinary: Negative.   Musculoskeletal: Negative.   Skin: Negative.        Objective:    Physical Exam Vitals reviewed.  HENT:     Head: Normocephalic.     Comments: Ear discomfort and fullness    Right Ear: External ear normal. There is no impacted cerumen.     Left Ear: External ear normal. There is no impacted cerumen.     Nose: Nose normal.  Cardiovascular:     Rate and Rhythm: Normal rate.     Pulses: Normal pulses.     Heart sounds: Normal heart sounds.  Pulmonary:     Effort: Pulmonary effort is normal.     Breath sounds: Normal breath sounds.  Abdominal:     General: Bowel sounds are normal.     Tenderness: There is no abdominal tenderness. There is no right CVA tenderness or left CVA tenderness.  Musculoskeletal:     Cervical back: Normal range of motion.  Skin:    General: Skin is warm.  Neurological:     Mental Status: She is alert and oriented to person, place, and time.     There were no vitals taken for this visit. Wt Readings from Last 3 Encounters:  01/15/20 209 lb (94.8 kg)  08/09/19 232 lb (105.2 kg)  05/28/19 228 lb (103.4 kg)    Health Maintenance Due  Topic Date Due  . COVID-19 Vaccine (1) Never done  . CHLAMYDIA SCREENING  Never done  . PAP-Cervical Cytology Screening  Never done  . PAP SMEAR-Modifier  Never done  . TETANUS/TDAP  06/11/2020  . INFLUENZA VACCINE  06/07/2020    There are no preventive care reminders to display for this patient.   No results found for: TSH No results found for: WBC, HGB, HCT, MCV, PLT No results found for: NA, K, CHLORIDE, CO2, GLUCOSE, BUN, CREATININE, BILITOT, ALKPHOS, AST, ALT, PROT, ALBUMIN, CALCIUM, ANIONGAP, EGFR, GFR No results found for: CHOL No results found for: HDL No results found for: LDLCALC No results found for: TRIG No results found  for: CHOLHDL No results found for: HGBA1C     Assessment & Plan:  Otalgia, left ear Patient is a 19 year old female who presents to clinic for otalgia of the left ear. This is new for patient in the last 5 days. The problem has gradually improved. There has been no fever, or chills. The pain is at a  severity of 0 out of 10. No ear discharge. On assessment ear canal is patent, mild spots on the left eardrum. Patient reports mild pressure with no ringing. Advised patient to use Tylenol as needed for pain, Flonase to relieve ear pressure. Patient is 5 months pregnant. Provided education with printed handouts Rx sent to pharmacy. Advised patient to follow-up with worsening or unresolved symptoms.  Problem List Items Addressed This Visit      Other   Exposure to sexually transmitted disease (STD) - Primary   Relevant Orders   Pregnancy, urine   Urine cytology ancillary only   HIV antibody (with reflex)       No orders of the defined types were placed in this encounter.    Ivy Lynn, NP

## 2020-06-25 NOTE — Assessment & Plan Note (Signed)
Patient is a 19 year old female who presents to clinic for otalgia of the left ear. This is new for patient in the last 5 days. The problem has gradually improved. There has been no fever, or chills. The pain is at a severity of 0 out of 10. No ear discharge. On assessment ear canal is patent, mild spots on the left eardrum. Patient reports mild pressure with no ringing. Advised patient to use Tylenol as needed for pain, Flonase to relieve ear pressure. Patient is 5 months pregnant. Provided education with printed handouts Rx sent to pharmacy. Advised patient to follow-up with worsening or unresolved symptoms.

## 2020-06-29 DIAGNOSIS — F331 Major depressive disorder, recurrent, moderate: Secondary | ICD-10-CM | POA: Diagnosis not present

## 2020-06-29 DIAGNOSIS — F902 Attention-deficit hyperactivity disorder, combined type: Secondary | ICD-10-CM | POA: Diagnosis not present

## 2020-06-29 DIAGNOSIS — F411 Generalized anxiety disorder: Secondary | ICD-10-CM | POA: Diagnosis not present

## 2020-07-01 DIAGNOSIS — Z363 Encounter for antenatal screening for malformations: Secondary | ICD-10-CM | POA: Diagnosis not present

## 2020-07-09 ENCOUNTER — Telehealth: Payer: Self-pay | Admitting: Family Medicine

## 2020-07-09 ENCOUNTER — Other Ambulatory Visit: Payer: Self-pay | Admitting: *Deleted

## 2020-07-09 MED ORDER — NEOMYCIN-POLYMYXIN-HC 1 % OT SOLN: OTIC | 0 refills | Status: DC

## 2020-07-09 NOTE — Telephone Encounter (Signed)
Medication sent to pharmacy and patient aware.  

## 2020-07-09 NOTE — Telephone Encounter (Signed)
Please send her in Cortisporin HC OTIC drops 3 drops in affected ear 4 times daily x7 days.  Follow up if no improvement

## 2020-07-09 NOTE — Telephone Encounter (Signed)
Pt wants to know if anything else can be prescribed to her to take for ear pain because she says what was prescribed to her isnt helping. Pt unsure of what she can take due to being pregnant.

## 2020-07-09 NOTE — Telephone Encounter (Signed)
Seen je covering pcp please advise

## 2020-07-27 DIAGNOSIS — Z79891 Long term (current) use of opiate analgesic: Secondary | ICD-10-CM | POA: Diagnosis not present

## 2020-07-27 DIAGNOSIS — Z79899 Other long term (current) drug therapy: Secondary | ICD-10-CM | POA: Diagnosis not present

## 2020-07-27 DIAGNOSIS — F902 Attention-deficit hyperactivity disorder, combined type: Secondary | ICD-10-CM | POA: Diagnosis not present

## 2020-07-27 DIAGNOSIS — F331 Major depressive disorder, recurrent, moderate: Secondary | ICD-10-CM | POA: Diagnosis not present

## 2020-07-27 DIAGNOSIS — F411 Generalized anxiety disorder: Secondary | ICD-10-CM | POA: Diagnosis not present

## 2020-08-08 DIAGNOSIS — Z79899 Other long term (current) drug therapy: Secondary | ICD-10-CM | POA: Diagnosis not present

## 2020-08-08 DIAGNOSIS — F329 Major depressive disorder, single episode, unspecified: Secondary | ICD-10-CM | POA: Diagnosis not present

## 2020-08-08 DIAGNOSIS — Z3A24 24 weeks gestation of pregnancy: Secondary | ICD-10-CM | POA: Diagnosis not present

## 2020-08-08 DIAGNOSIS — R109 Unspecified abdominal pain: Secondary | ICD-10-CM | POA: Diagnosis not present

## 2020-08-08 DIAGNOSIS — F988 Other specified behavioral and emotional disorders with onset usually occurring in childhood and adolescence: Secondary | ICD-10-CM | POA: Diagnosis not present

## 2020-08-08 DIAGNOSIS — O99342 Other mental disorders complicating pregnancy, second trimester: Secondary | ICD-10-CM | POA: Diagnosis not present

## 2020-08-08 DIAGNOSIS — O99891 Other specified diseases and conditions complicating pregnancy: Secondary | ICD-10-CM | POA: Diagnosis not present

## 2020-08-08 DIAGNOSIS — O26892 Other specified pregnancy related conditions, second trimester: Secondary | ICD-10-CM | POA: Diagnosis not present

## 2020-08-08 DIAGNOSIS — M549 Dorsalgia, unspecified: Secondary | ICD-10-CM | POA: Diagnosis not present

## 2020-08-08 DIAGNOSIS — F419 Anxiety disorder, unspecified: Secondary | ICD-10-CM | POA: Diagnosis not present

## 2020-08-25 DIAGNOSIS — Z3402 Encounter for supervision of normal first pregnancy, second trimester: Secondary | ICD-10-CM | POA: Diagnosis not present

## 2020-08-25 DIAGNOSIS — Z3483 Encounter for supervision of other normal pregnancy, third trimester: Secondary | ICD-10-CM | POA: Diagnosis not present

## 2020-08-25 DIAGNOSIS — Z23 Encounter for immunization: Secondary | ICD-10-CM | POA: Diagnosis not present

## 2020-09-05 DIAGNOSIS — R102 Pelvic and perineal pain: Secondary | ICD-10-CM | POA: Diagnosis not present

## 2020-09-05 DIAGNOSIS — Z3A28 28 weeks gestation of pregnancy: Secondary | ICD-10-CM | POA: Diagnosis not present

## 2020-09-05 DIAGNOSIS — O26893 Other specified pregnancy related conditions, third trimester: Secondary | ICD-10-CM | POA: Diagnosis not present

## 2020-09-09 DIAGNOSIS — Z331 Pregnant state, incidental: Secondary | ICD-10-CM | POA: Diagnosis not present

## 2020-09-09 DIAGNOSIS — Z23 Encounter for immunization: Secondary | ICD-10-CM | POA: Diagnosis not present

## 2020-09-09 DIAGNOSIS — O360931 Maternal care for other rhesus isoimmunization, third trimester, fetus 1: Secondary | ICD-10-CM | POA: Diagnosis not present

## 2020-09-09 DIAGNOSIS — Z418 Encounter for other procedures for purposes other than remedying health state: Secondary | ICD-10-CM | POA: Diagnosis not present

## 2020-09-30 ENCOUNTER — Ambulatory Visit: Payer: Medicaid Other | Admitting: Family Medicine

## 2020-10-06 ENCOUNTER — Encounter: Payer: Self-pay | Admitting: Family Medicine

## 2020-10-07 DIAGNOSIS — Z419 Encounter for procedure for purposes other than remedying health state, unspecified: Secondary | ICD-10-CM | POA: Diagnosis not present

## 2020-10-28 DIAGNOSIS — Z113 Encounter for screening for infections with a predominantly sexual mode of transmission: Secondary | ICD-10-CM | POA: Diagnosis not present

## 2020-10-28 DIAGNOSIS — Z3685 Encounter for antenatal screening for Streptococcus B: Secondary | ICD-10-CM | POA: Diagnosis not present

## 2020-10-28 DIAGNOSIS — O99213 Obesity complicating pregnancy, third trimester: Secondary | ICD-10-CM | POA: Diagnosis not present

## 2020-10-28 DIAGNOSIS — O3663X1 Maternal care for excessive fetal growth, third trimester, fetus 1: Secondary | ICD-10-CM | POA: Diagnosis not present

## 2020-10-28 DIAGNOSIS — Z3483 Encounter for supervision of other normal pregnancy, third trimester: Secondary | ICD-10-CM | POA: Diagnosis not present

## 2020-11-04 DIAGNOSIS — M7989 Other specified soft tissue disorders: Secondary | ICD-10-CM | POA: Diagnosis not present

## 2020-11-04 DIAGNOSIS — O1203 Gestational edema, third trimester: Secondary | ICD-10-CM | POA: Diagnosis not present

## 2020-11-05 DIAGNOSIS — S6991XA Unspecified injury of right wrist, hand and finger(s), initial encounter: Secondary | ICD-10-CM | POA: Diagnosis not present

## 2020-11-07 DIAGNOSIS — Z419 Encounter for procedure for purposes other than remedying health state, unspecified: Secondary | ICD-10-CM | POA: Diagnosis not present

## 2020-11-18 DIAGNOSIS — O99213 Obesity complicating pregnancy, third trimester: Secondary | ICD-10-CM | POA: Diagnosis not present

## 2020-11-18 DIAGNOSIS — O3663X1 Maternal care for excessive fetal growth, third trimester, fetus 1: Secondary | ICD-10-CM | POA: Diagnosis not present

## 2020-11-23 DIAGNOSIS — O471 False labor at or after 37 completed weeks of gestation: Secondary | ICD-10-CM | POA: Diagnosis not present

## 2020-11-23 DIAGNOSIS — Z3A39 39 weeks gestation of pregnancy: Secondary | ICD-10-CM | POA: Diagnosis not present

## 2020-11-25 DIAGNOSIS — O99344 Other mental disorders complicating childbirth: Secondary | ICD-10-CM | POA: Diagnosis not present

## 2020-11-25 DIAGNOSIS — Z8616 Personal history of COVID-19: Secondary | ICD-10-CM | POA: Diagnosis not present

## 2020-11-25 DIAGNOSIS — O3663X0 Maternal care for excessive fetal growth, third trimester, not applicable or unspecified: Secondary | ICD-10-CM | POA: Diagnosis not present

## 2020-11-25 DIAGNOSIS — O471 False labor at or after 37 completed weeks of gestation: Secondary | ICD-10-CM | POA: Diagnosis not present

## 2020-11-25 DIAGNOSIS — F32A Depression, unspecified: Secondary | ICD-10-CM | POA: Diagnosis not present

## 2020-11-25 DIAGNOSIS — F419 Anxiety disorder, unspecified: Secondary | ICD-10-CM | POA: Diagnosis not present

## 2020-11-25 DIAGNOSIS — F909 Attention-deficit hyperactivity disorder, unspecified type: Secondary | ICD-10-CM | POA: Diagnosis not present

## 2020-11-25 DIAGNOSIS — Z3403 Encounter for supervision of normal first pregnancy, third trimester: Secondary | ICD-10-CM | POA: Diagnosis not present

## 2020-11-25 DIAGNOSIS — Z3A40 40 weeks gestation of pregnancy: Secondary | ICD-10-CM | POA: Diagnosis not present

## 2020-11-26 DIAGNOSIS — Z3A40 40 weeks gestation of pregnancy: Secondary | ICD-10-CM | POA: Diagnosis not present

## 2020-12-08 DIAGNOSIS — Z419 Encounter for procedure for purposes other than remedying health state, unspecified: Secondary | ICD-10-CM | POA: Diagnosis not present

## 2021-01-03 DIAGNOSIS — R102 Pelvic and perineal pain: Secondary | ICD-10-CM | POA: Diagnosis not present

## 2021-01-04 ENCOUNTER — Telehealth: Payer: Self-pay

## 2021-01-04 NOTE — Telephone Encounter (Signed)
Transition Care Management Unsuccessful Follow-up Telephone Call  Date of discharge and from where:  01/03/2021 from [facility].  Attempts:  1st Attempt  Reason for unsuccessful TCM follow-up call:  Left voice message

## 2021-01-05 DIAGNOSIS — Z419 Encounter for procedure for purposes other than remedying health state, unspecified: Secondary | ICD-10-CM | POA: Diagnosis not present

## 2021-01-05 NOTE — Telephone Encounter (Signed)
Transition Care Management Unsuccessful Follow-up Telephone Call  Date of discharge and from where:  01/03/2021 from [facility]  Attempts:  2nd Attempt  Reason for unsuccessful TCM follow-up call:  Left voice message

## 2021-01-06 DIAGNOSIS — F331 Major depressive disorder, recurrent, moderate: Secondary | ICD-10-CM | POA: Diagnosis not present

## 2021-01-06 DIAGNOSIS — F411 Generalized anxiety disorder: Secondary | ICD-10-CM | POA: Diagnosis not present

## 2021-01-06 DIAGNOSIS — F902 Attention-deficit hyperactivity disorder, combined type: Secondary | ICD-10-CM | POA: Diagnosis not present

## 2021-01-06 NOTE — Telephone Encounter (Signed)
Transition Care Management Unsuccessful Follow-up Telephone Call  Date of discharge and from where:  01/03/2021 from [facility].  Attempts:  3rd Attempt  Reason for unsuccessful TCM follow-up call:  Left voice message

## 2021-01-07 DIAGNOSIS — Z130 Encounter for screening for diseases of the blood and blood-forming organs and certain disorders involving the immune mechanism: Secondary | ICD-10-CM | POA: Diagnosis not present

## 2021-02-05 DIAGNOSIS — Z419 Encounter for procedure for purposes other than remedying health state, unspecified: Secondary | ICD-10-CM | POA: Diagnosis not present

## 2021-03-07 DIAGNOSIS — Z419 Encounter for procedure for purposes other than remedying health state, unspecified: Secondary | ICD-10-CM | POA: Diagnosis not present

## 2021-04-07 DIAGNOSIS — Z419 Encounter for procedure for purposes other than remedying health state, unspecified: Secondary | ICD-10-CM | POA: Diagnosis not present

## 2021-04-21 ENCOUNTER — Other Ambulatory Visit: Payer: Self-pay

## 2021-04-21 ENCOUNTER — Ambulatory Visit: Payer: Medicaid Other | Admitting: Family Medicine

## 2021-04-21 ENCOUNTER — Other Ambulatory Visit (HOSPITAL_COMMUNITY)
Admission: RE | Admit: 2021-04-21 | Discharge: 2021-04-21 | Disposition: A | Discharge status: Home / Self Care | Payer: Medicaid Other | Source: Ambulatory Visit | Attending: Family Medicine | Admitting: Family Medicine

## 2021-04-21 ENCOUNTER — Encounter: Payer: Self-pay | Admitting: Family Medicine

## 2021-04-21 VITALS — BP 111/68 | HR 57 | Temp 98.3°F | Ht 63.0 in | Wt 212.6 lb

## 2021-04-21 DIAGNOSIS — Z113 Encounter for screening for infections with a predominantly sexual mode of transmission: Secondary | ICD-10-CM | POA: Diagnosis not present

## 2021-04-21 DIAGNOSIS — Z7251 High risk heterosexual behavior: Secondary | ICD-10-CM

## 2021-04-21 DIAGNOSIS — Z124 Encounter for screening for malignant neoplasm of cervix: Secondary | ICD-10-CM | POA: Insufficient documentation

## 2021-04-21 NOTE — Progress Notes (Signed)
Subjective: CC: STD check PCP: Raliegh Ip, DO ZOX:WRUEAVW Vickie Dillon is a 20 y.o. female presenting to clinic today for:  1.  STD check Patient is here requesting STD check.  She has not engaged in sexual intercourse in a while.  She had a baby about 5 months ago.  She reports occasional vaginal discharge with last being a couple of weeks ago which was slightly more than normal but did not carry any odor or color.  Her last menstrual cycle was June 10.  Denies any pelvic pain, postcoital bleeding, vaginal itching.  Not on any contraception.   ROS: Per HPI  No Known Allergies Past Medical History:  Diagnosis Date   ADHD    Allergy    Anxiety    Depression     Current Outpatient Medications:    acetaminophen (TYLENOL) 500 MG tablet, Take 1 tablet (500 mg total) by mouth every 6 (six) hours as needed., Disp: 30 tablet, Rfl: 0   fluticasone (FLONASE) 50 MCG/ACT nasal spray, Place 1 spray into both nostrils daily., Disp: 11.1 mL, Rfl: 2 Social History   Socioeconomic History   Marital status: Single    Spouse name: Not on file   Number of children: Not on file   Years of education: Not on file   Highest education level: Not on file  Occupational History   Occupation: Student     Comment: [employer], psychology  Tobacco Use   Smoking status: Never   Smokeless tobacco: Never  Vaping Use   Vaping Use: Some days  Substance and Sexual Activity   Alcohol use: Not Currently    Comment: occ   Drug use: Not Currently   Sexual activity: Yes    Birth control/protection: Condom  Other Topics Concern   Not on file  Social History Narrative   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  She has a boyfriend.   Social Determinants of Health   Financial Resource Strain: Not on file  Food Insecurity: Not on file  Transportation Needs: Not on file  Physical Activity: Not on file  Stress: Not on file  Social Connections: Not on file  Intimate Partner Violence:  Not on file   Family History  Problem Relation Age of Onset   Hypertension Mother    Hypothyroidism Mother    Depression Mother    Anxiety disorder Mother    Bipolar disorder Mother    Hyperlipidemia Mother    Migraines Mother    Multiple sclerosis Father    Depression Father    Bipolar disorder Father    Hypertension Father    Anxiety disorder Maternal Grandmother    Hyperlipidemia Maternal Grandfather    Hypertension Maternal Grandfather    Nephrolithiasis Maternal Grandfather    Diabetes Paternal Grandmother    Diabetes Paternal Grandfather    Heart attack Paternal Grandfather    Anxiety disorder Half-Brother    Migraines Half-Sister    Bipolar disorder Half-Sister    Anxiety disorder Half-Sister     Objective: Office vital signs reviewed. BP 111/68   Pulse (!) 57   Temp 98.3 F (36.8 C)   Ht 5\' 3"  (1.6 m)   Wt 212 lb 9.6 oz (96.4 kg)   LMP 04/16/2021   SpO2 100%   BMI 37.66 kg/m   Physical Examination:  General: Awake, alert, well nourished, No acute distress GU: external vaginal tissue normal, cervix retroverted, no punctate lesions on cervix appreciated, mild bloody discharge from cervical os,  no cervical motion tenderness, no abdominal/ adnexal masses   Assessment/ Plan: 20 y.o. female   Screen for STD (sexually transmitted disease) - Plan: Cytology - PAP  High risk heterosexual behavior - Plan: Cytology - PAP  Screening for malignant neoplasm of cervix - Plan: Cytology - PAP  Some old blood noted in vaginal vault with slight odor but otherwise no abnormalities noted on exam.  STD screening sent as per request.  Declined blood draw.  Follow up prn.  No orders of the defined types were placed in this encounter.  No orders of the defined types were placed in this encounter.    Raliegh Ip, DO [facility] [phone]

## 2021-04-27 ENCOUNTER — Encounter: Payer: Self-pay | Admitting: Family Medicine

## 2021-04-27 DIAGNOSIS — R87610 Atypical squamous cells of undetermined significance on cytologic smear of cervix (ASC-US): Secondary | ICD-10-CM | POA: Insufficient documentation

## 2021-04-27 LAB — CYTOLOGY - PAP
Chlamydia: NEGATIVE
Comment: NEGATIVE
Comment: NEGATIVE
Comment: NEGATIVE
Comment: NEGATIVE
Comment: NORMAL
Diagnosis: UNDETERMINED — AB
HSV1: NEGATIVE
HSV2: NEGATIVE
High risk HPV: NEGATIVE
Neisseria Gonorrhea: NEGATIVE
Trichomonas: NEGATIVE

## 2021-05-07 DIAGNOSIS — Z419 Encounter for procedure for purposes other than remedying health state, unspecified: Secondary | ICD-10-CM | POA: Diagnosis not present

## 2021-05-12 ENCOUNTER — Telehealth: Payer: Self-pay | Admitting: Family Medicine

## 2021-05-12 NOTE — Telephone Encounter (Signed)
Spoke with patient, appointment scheduled for 05/21/21 at 2:30 pm with Dr. Nadine Counts to discuss medications.

## 2021-05-21 ENCOUNTER — Ambulatory Visit: Payer: Medicaid Other | Admitting: Family Medicine

## 2021-05-21 ENCOUNTER — Other Ambulatory Visit: Payer: Self-pay

## 2021-05-21 ENCOUNTER — Encounter: Payer: Self-pay | Admitting: Family Medicine

## 2021-05-21 VITALS — BP 106/64 | HR 59 | Temp 98.2°F | Ht 63.0 in | Wt 214.4 lb

## 2021-05-21 DIAGNOSIS — F411 Generalized anxiety disorder: Secondary | ICD-10-CM | POA: Diagnosis not present

## 2021-05-21 DIAGNOSIS — F339 Major depressive disorder, recurrent, unspecified: Secondary | ICD-10-CM

## 2021-05-21 MED ORDER — SERTRALINE HCL 50 MG PO TABS
50.0000 mg | ORAL_TABLET | Freq: Every day | ORAL | 1 refills | Status: DC
Start: 2021-05-21 — End: 2021-08-05

## 2021-05-21 MED ORDER — HYDROXYZINE HCL 50 MG PO TABS: 25.0000 mg | ORAL_TABLET | Freq: Three times a day (TID) | ORAL | 0 refills | Status: DC | PRN

## 2021-05-21 NOTE — Progress Notes (Signed)
Subjective: CC: Anxiety and depression PCP: Raliegh Ip, DO ERD:EYCXKGY Vickie Dillon is a 20 y.o. female presenting to clinic today for:  1.  Anxiety and depression Patient reports that anxiety depression really are not resolving in fact they seem a little worse.  She does admit that some of this is situational secondary to financial stressors and moving into a new apartment.  Additionally her child is teething and she has been having frequently interrupted sleep.  No SI or HI.  Previously treated by psychiatry in [place] but she is been lost to follow-up since the birth of her daughter.  Apparently was previously treated with Concerta for ADHD as well.  Previous intolerances include Lexapro but she cannot recall why she was taken off that medicine.  She is not breast-feeding.   ROS: Per HPI  No Known Allergies Past Medical History:  Diagnosis Date   ADHD    Allergy    Anxiety    Depression    No current outpatient medications on file. 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  She has a boyfriend.   Social Determinants of Health   Financial Resource Strain: Not on file  Food Insecurity: Not on file  Transportation Needs: Not on file  Physical Activity: Not on file  Stress: Not on file  Social Connections: Not on file  Intimate Partner  Violence: Not on file   Family History  Problem Relation Age of Onset   Hypertension Mother    Hypothyroidism Mother    Depression Mother    Anxiety disorder Mother    Bipolar disorder Mother    Hyperlipidemia Mother    Migraines Mother    Multiple sclerosis Father    Depression Father    Bipolar disorder Father    Hypertension Father    Anxiety disorder Maternal Grandmother    Hyperlipidemia Maternal Grandfather    Hypertension Maternal Grandfather    Nephrolithiasis Maternal Grandfather    Diabetes Paternal Grandmother    Diabetes Paternal Grandfather    Heart attack Paternal Grandfather    Anxiety disorder Half-Brother    Migraines Half-Sister    Bipolar disorder Half-Sister    Anxiety disorder Half-Sister     Objective: Office vital signs reviewed. BP 106/64   Pulse (!) 59   Temp 98.2 F (36.8 C)   Ht 5\' 3"  (1.6 m)   Wt 214 lb 6.4 oz (97.3 kg)   LMP 05/15/2021   SpO2 99%   BMI 37.98 kg/m   Physical Examination:  General: Awake, alert, well nourished, No acute distress Psych: mood stable, patient is pleasant and interactive Depression screen [facility] 2/9 05/21/2021 04/21/2021 06/25/2020  Decreased Interest 0 0 0  Down, Depressed, Hopeless 1 1 0  PHQ -  2 Score 1 1 0  Altered sleeping 1 0 -  Tired, decreased energy 2 2 -  Change in appetite 3 3 -  Feeling bad or failure about yourself  1 2 -  Trouble concentrating 0 0 -  Moving slowly or fidgety/restless 0 0 -  Suicidal thoughts 0 0 -  PHQ-9 Score 8 8 -  Difficult doing work/chores Extremely dIfficult Not difficult at all -   GAD 7 : Generalized Anxiety Score 05/21/2021 04/21/2021 01/15/2020 05/06/2019  Nervous, Anxious, on Edge 1 1 2 2   Control/stop worrying 2 1 2 2   Worry too much - different things 3 3 2 3   Trouble relaxing 3 2 1 1   Restless 1 0 1 3  Easily annoyed or irritable 1 1 1  0  Afraid - awful might happen 1 1 2 2   Total GAD 7 Score 12 9 11 13   Anxiety Difficulty Very difficult Somewhat difficult  Somewhat difficult Somewhat difficult      Assessment/ Plan: 20 y.o. female   Depression, recurrent (HCC) - Plan: sertraline (ZOLOFT) 50 MG tablet  Generalized anxiety disorder - Plan: sertraline (ZOLOFT) 50 MG tablet, hydrOXYzine (ATARAX/VISTARIL) 50 MG tablet  Recommended consideration for appt with psychiatrist in [place] as well.  Trial zoloft, recheck in 6 weeks.  Atarax prn panic/ sleep  Crisis hotline numbers provided  No orders of the defined types were placed in this encounter.  No orders of the defined types were placed in this encounter.    , DO [facility] [phone]

## 2021-05-21 NOTE — Patient Instructions (Signed)
Taking the medicine as directed and not missing any doses is one of the best things you can do to treat your depression.  Here are some things to keep in mind:  Side effects (stomach upset, some increased anxiety) may happen before you notice a benefit.  These side effects typically go away over time. Changes to your dose of medicine or a change in medication all together is sometimes necessary Most people need to be on medication at least 12 months Many people will notice an improvement within two weeks but the full effect of the medication can take up to 4-6 weeks Stopping the medication when you start feeling better often results in a return of symptoms Never discontinue your medication without contacting a health care professional first.  Some medications require gradual discontinuation/ taper and can make you sick if you stop them abruptly.  If your symptoms worsen or you have thoughts of suicide/homicide, PLEASE SEEK IMMEDIATE MEDICAL ATTENTION.  You may always call:  National Suicide Hotline: 800-273-8255 [place] Crisis Line: 336-832-9700 Crisis Recovery in [place]: 800-939-5911   These are available 24 hours a day, 7 days a week.  

## 2021-06-07 DIAGNOSIS — Z419 Encounter for procedure for purposes other than remedying health state, unspecified: Secondary | ICD-10-CM | POA: Diagnosis not present

## 2021-06-21 ENCOUNTER — Telehealth: Payer: Self-pay

## 2021-06-21 NOTE — Telephone Encounter (Signed)
Transition Care Management Unsuccessful Follow-up Telephone Call  Date of discharge and from where:  06/19/2021 from [facility]  Attempts:  1st Attempt  Reason for unsuccessful TCM follow-up call:  Left voice message

## 2021-06-22 NOTE — Telephone Encounter (Signed)
Transition Care Management Follow-up Telephone Call Date of discharge and from where: 06/19/2021 from [facility] How have you been since you were released from the hospital? Patient states that she is doing well. Pt stated that she did not receive Tdap at [facility]. I did inform patient that her records indicate that she is over due for the tetanus and to reach out to pcp office to confirm their recommendation to receive the vaccine. Pt stated understanding and will call office this morning.  Any questions or concerns? No  Items Reviewed: Did the pt receive and understand the discharge instructions provided? Yes  Medications obtained and verified? Yes  Other? No  Any new allergies since your discharge? No  Dietary orders reviewed? No Do you have support at home? Yes   Functional Questionnaire: (I = Independent and D = Dependent) ADLs: I  Bathing/Dressing- I  Meal Prep- I  Eating- I  Maintaining continence- I  Transferring/Ambulation- I  Managing Meds- I   Follow up appointments reviewed:  PCP Hospital f/u appt confirmed? No  Patient stated that she would call PCP office.  Specialist Hospital f/u appt confirmed? No   Are transportation arrangements needed? No  If their condition worsens, is the pt aware to call PCP or go to the Emergency Dept.? Yes Was the patient provided with contact information for the PCP's office or ED? Yes Was to pt encouraged to call back with questions or concerns? Yes

## 2021-06-28 DIAGNOSIS — Z09 Encounter for follow-up examination after completed treatment for conditions other than malignant neoplasm: Secondary | ICD-10-CM | POA: Diagnosis not present

## 2021-06-28 DIAGNOSIS — W540XXD Bitten by dog, subsequent encounter: Secondary | ICD-10-CM | POA: Diagnosis not present

## 2021-07-02 ENCOUNTER — Ambulatory Visit (INDEPENDENT_AMBULATORY_CARE_PROVIDER_SITE_OTHER): Payer: Medicaid Other | Admitting: Family Medicine

## 2021-07-02 DIAGNOSIS — Z91199 Patient's noncompliance with other medical treatment and regimen due to unspecified reason: Secondary | ICD-10-CM

## 2021-07-02 DIAGNOSIS — Z5329 Procedure and treatment not carried out because of patient's decision for other reasons: Secondary | ICD-10-CM

## 2021-07-02 NOTE — Progress Notes (Signed)
Telephone visit  Patient to reschedule appt if still needed  Start time: 3:36pm (LVM); 3:55pm (LVM); 4:30pm (no answer)  Raliegh Ip, DO [facility] [phone]

## 2021-07-08 DIAGNOSIS — Z419 Encounter for procedure for purposes other than remedying health state, unspecified: Secondary | ICD-10-CM | POA: Diagnosis not present

## 2021-08-04 ENCOUNTER — Ambulatory Visit: Payer: Medicaid Other | Admitting: Nurse Practitioner

## 2021-08-05 ENCOUNTER — Other Ambulatory Visit (HOSPITAL_COMMUNITY)
Admission: RE | Admit: 2021-08-05 | Discharge: 2021-08-05 | Disposition: A | Discharge status: Home / Self Care | Payer: Medicaid Other | Source: Ambulatory Visit | Attending: Family Medicine | Admitting: Family Medicine

## 2021-08-05 ENCOUNTER — Other Ambulatory Visit: Payer: Self-pay

## 2021-08-05 ENCOUNTER — Encounter: Payer: Self-pay | Admitting: Family Medicine

## 2021-08-05 ENCOUNTER — Ambulatory Visit (INDEPENDENT_AMBULATORY_CARE_PROVIDER_SITE_OTHER): Payer: Medicaid Other | Admitting: Family Medicine

## 2021-08-05 VITALS — BP 102/66 | HR 66 | Temp 98.0°F | Ht 65.0 in | Wt 199.0 lb

## 2021-08-05 DIAGNOSIS — Z113 Encounter for screening for infections with a predominantly sexual mode of transmission: Secondary | ICD-10-CM | POA: Diagnosis not present

## 2021-08-05 DIAGNOSIS — Z7251 High risk heterosexual behavior: Secondary | ICD-10-CM

## 2021-08-05 DIAGNOSIS — N760 Acute vaginitis: Secondary | ICD-10-CM

## 2021-08-05 DIAGNOSIS — B9689 Other specified bacterial agents as the cause of diseases classified elsewhere: Secondary | ICD-10-CM

## 2021-08-05 DIAGNOSIS — Z23 Encounter for immunization: Secondary | ICD-10-CM | POA: Diagnosis not present

## 2021-08-05 LAB — URINALYSIS, ROUTINE W REFLEX MICROSCOPIC
Bilirubin, UA: NEGATIVE
Glucose, UA: NEGATIVE
Ketones, UA: NEGATIVE
Leukocytes,UA: NEGATIVE
Nitrite, UA: NEGATIVE
Protein,UA: NEGATIVE
Specific Gravity, UA: >1.03 — ABNORMAL HIGH (ref 1.005–1.030)
Urobilinogen, Ur: 0.2 mg/dL (ref 0.2–1.0)
pH, UA: 6 (ref 5.0–7.5)

## 2021-08-05 LAB — WET PREP FOR TRICH, YEAST, CLUE
Clue Cell Exam: POSITIVE — AB
Trichomonas Exam: NEGATIVE
Yeast Exam: NEGATIVE

## 2021-08-05 LAB — MICROSCOPIC EXAMINATION
Bacteria, UA: NONE SEEN
Renal Epithel, UA: NONE SEEN /hpf

## 2021-08-05 LAB — PREGNANCY, URINE: Preg Test, Ur: NEGATIVE

## 2021-08-05 MED ORDER — METRONIDAZOLE 500 MG PO TABS: 500.0000 mg | ORAL_TABLET | Freq: Two times a day (BID) | ORAL | 0 refills | Status: DC

## 2021-08-05 NOTE — Progress Notes (Signed)
Subjective:  Patient ID: Vickie Dillon, female    DOB: 03/07/2001, 20 y.o.   MRN: [id number]  Patient Care Team: Raliegh Ip, DO as PCP - General (Family Medicine)   Chief Complaint:  STD recheck   HPI: Vickie Dillon is a 20 y.o. female presenting on 08/05/2021 for STD recheck  Pt presents today for STI screening. She denies symptoms. No new partner and has had same partner for several months. No vaginal bleeding, discharge, pruritis, or pain. No postcoital bleeding. No pelvic or abdominal pain. Not on contraception and dose not wish to start.   Relevant past medical, surgical, family, and social history reviewed and updated as indicated.  Allergies and medications reviewed and updated. Data reviewed: Chart in Epic.   Past Medical History:  Diagnosis Date   ADHD    Allergy    Anxiety    Depression     Past Surgical History:  Procedure Laterality Date   TONSILLECTOMY AND ADENOIDECTOMY  2006   TYMPANOSTOMY TUBE PLACEMENT Bilateral 2006    Social History   Socioeconomic History   Marital status: Single    Spouse name: Not on file   Number of children: Not on file   Years of education: Not on file   Highest education level: Not on file  Occupational History   Occupation: Student     Comment: [employer], psychology  Tobacco Use   Smoking status: Never   Smokeless tobacco: Never  Vaping Use   Vaping Use: Some days  Substance and Sexual Activity   Alcohol use: Not Currently    Comment: occ   Drug use: Not Currently   Sexual activity: Yes    Birth control/protection: Condom  Other Topics Concern   Not on file  Social History Narrative   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  She has a boyfriend.   Social Determinants of Health   Financial Resource Strain: Not on file  Food Insecurity: Not on file  Transportation Needs: Not on file  Physical Activity: Not on file  Stress: Not on file  Social Connections: Not on file  Intimate  Partner Violence: Not on file    Outpatient Encounter Medications as of 08/05/2021  Medication Sig   metroNIDAZOLE (FLAGYL) 500 MG tablet Take 1 tablet (500 mg total) by mouth 2 (two) times daily.   [DISCONTINUED] hydrOXYzine (ATARAX/VISTARIL) 50 MG tablet Take 0.5-1 tablets (25-50 mg total) by mouth 3 (three) times daily as needed for anxiety.   [DISCONTINUED] sertraline (ZOLOFT) 50 MG tablet Take 1 tablet (50 mg total) by mouth daily.   No facility-administered encounter medications on file as of 08/05/2021.    No Known Allergies  Review of Systems  Constitutional:  Negative for activity change, appetite change, chills, fatigue and fever.  HENT: Negative.    Eyes: Negative.   Respiratory:  Negative for cough, chest tightness and shortness of breath.   Cardiovascular:  Negative for chest pain, palpitations and leg swelling.  Gastrointestinal:  Negative for abdominal pain, blood in stool, constipation, diarrhea, nausea and vomiting.  Endocrine: Negative.   Genitourinary:  Negative for decreased urine volume, difficulty urinating, dyspareunia, dysuria, enuresis, flank pain, frequency, genital sores, hematuria, menstrual problem, pelvic pain, urgency, vaginal bleeding, vaginal discharge and vaginal pain.  Musculoskeletal:  Negative for arthralgias and myalgias.  Skin: Negative.   Allergic/Immunologic: Negative.   Neurological:  Negative for dizziness and headaches.  Hematological: Negative.   Psychiatric/Behavioral:  Negative for confusion, hallucinations, sleep  disturbance and suicidal ideas.   All other systems reviewed and are negative.      Objective:  BP 102/66   Pulse 66   Temp 98 F (36.7 C)   Ht 5\' 5"  (1.651 m)   Wt 199 lb (90.3 kg)   SpO2 98%   BMI 33.12 kg/m    Wt Readings from Last 3 Encounters:  08/05/21 199 lb (90.3 kg)  05/21/21 214 lb 6.4 oz (97.3 kg)  04/21/21 212 lb 9.6 oz (96.4 kg)    Physical Exam Vitals and nursing note reviewed.  Constitutional:       General: She is not in acute distress.    Appearance: Normal appearance. She is well-developed and well-groomed. She is not ill-appearing, toxic-appearing or diaphoretic.  HENT:     Head: Normocephalic and atraumatic.     Jaw: There is normal jaw occlusion.     Right Ear: Hearing normal.     Left Ear: Hearing normal.     Nose: Nose normal.     Mouth/Throat:     Lips: Pink.     Mouth: Mucous membranes are moist.     Pharynx: Oropharynx is clear. Uvula midline.  Eyes:     General: Lids are normal.     Extraocular Movements: Extraocular movements intact.     Conjunctiva/sclera: Conjunctivae normal.     Pupils: Pupils are equal, round, and reactive to light.  Neck:     Thyroid: No thyroid mass, thyromegaly or thyroid tenderness.     Vascular: No carotid bruit or JVD.     Trachea: Trachea and phonation normal.  Cardiovascular:     Rate and Rhythm: Normal rate and regular rhythm.     Chest Wall: PMI is not displaced.     Heart sounds: Normal heart sounds. No murmur heard.   No friction rub. No gallop.  Pulmonary:     Effort: Pulmonary effort is normal. No respiratory distress.     Breath sounds: Normal breath sounds. No wheezing.  Abdominal:     General: There is no distension or abdominal bruit.     Palpations: There is no hepatomegaly or splenomegaly.     Tenderness: There is no abdominal tenderness. There is no right CVA tenderness or left CVA tenderness.     Hernia: No hernia is present.  Genitourinary:    Comments: Deferred Musculoskeletal:        General: Normal range of motion.     Cervical back: Normal range of motion and neck supple.     Right lower leg: No edema.     Left lower leg: No edema.  Lymphadenopathy:     Cervical: No cervical adenopathy.  Skin:    General: Skin is warm and dry.     Capillary Refill: Capillary refill takes less than 2 seconds.     Coloration: Skin is not cyanotic, jaundiced or pale.     Findings: No rash.  Neurological:     General:  No focal deficit present.     Mental Status: She is alert and oriented to person, place, and time.     Cranial Nerves: Cranial nerves are intact.     Sensory: Sensation is intact.     Motor: Motor function is intact.     Coordination: Coordination is intact.     Gait: Gait is intact.     Deep Tendon Reflexes: Reflexes are normal and symmetric.  Psychiatric:        Attention and Perception: Attention and perception normal.  Mood and Affect: Mood and affect normal.        Speech: Speech normal.        Behavior: Behavior normal. Behavior is cooperative.        Thought Content: Thought content normal.        Cognition and Memory: Cognition and memory normal.        Judgment: Judgment normal.    Results for orders placed or performed in visit on 04/21/21  Cytology - PAP  Result Value Ref Range   Trichomonas Negative    Chlamydia Negative    Neisseria Gonorrhea Negative    High risk HPV Negative    HSV1 Negative    HSV2 Negative    Adequacy      Satisfactory for evaluation; transformation zone component PRESENT.   Diagnosis (A)     - Atypical squamous cells of undetermined significance (ASC-US)   Microorganisms Shift in flora suggestive of bacterial vaginosis    Comment Normal Reference Range Trichomonas - Negative    Comment Normal Reference Ranger Chlamydia - Negative    Comment      Normal Reference Range Neisseria Gonorrhea - Negative   Comment Normal Reference Range HPV - Negative    Comment Normal Reference Range HSV - Negative      Urinalysis unremarkable. Wet prep with clue cells.   Pertinent labs & imaging results that were available during my care of the patient were reviewed by me and considered in my medical decision making.  Assessment & Plan:  Vickie Dillon was seen today for std recheck.  Diagnoses and all orders for this visit:  Screen for STD (sexually transmitted disease) High risk heterosexual behavior Wet prep revealed clue cells. Urine unremarkable.  Pregnancy negative. Safe sex behavior discussed in detail. Will notify pt when other testing results.  -     Urine cytology ancillary only -     WET PREP FOR TRICH, YEAST, CLUE -     Urinalysis, Routine w reflex microscopic  BV (bacterial vaginosis) Symptomatic care discussed in detail. Will treat with below. Pt aware to avoid alcohol while on medications.  -     metroNIDAZOLE (FLAGYL) 500 MG tablet; Take 1 tablet (500 mg total) by mouth 2 (two) times daily.   Influenza vaccine given today  Continue all other maintenance medications.  Follow up plan: Return if symptoms worsen or fail to improve.   Continue healthy lifestyle choices, including diet (rich in fruits, vegetables, and lean proteins, and low in salt and simple carbohydrates) and exercise (at least 30 minutes of moderate physical activity daily).  Educational handout given for safe sex, BV  The above assessment and management plan was discussed with the patient. The patient verbalized understanding of and has agreed to the management plan. Patient is aware to call the clinic if they develop any new symptoms or if symptoms persist or worsen. Patient is aware when to return to the clinic for a follow-up visit. Patient educated on when it is appropriate to go to the emergency department.   Kari Baars, FNP-C [facility] [phone]

## 2021-08-06 LAB — URINE CYTOLOGY ANCILLARY ONLY
Bacterial Vaginitis-Urine: NEGATIVE
Candida Urine: NEGATIVE
Chlamydia: NEGATIVE
Comment: NEGATIVE
Comment: NEGATIVE
Comment: NORMAL
Neisseria Gonorrhea: NEGATIVE
Trichomonas: NEGATIVE

## 2021-08-07 DIAGNOSIS — Z419 Encounter for procedure for purposes other than remedying health state, unspecified: Secondary | ICD-10-CM | POA: Diagnosis not present

## 2021-08-09 ENCOUNTER — Encounter: Payer: Self-pay | Admitting: Family Medicine

## 2021-09-07 DIAGNOSIS — Z419 Encounter for procedure for purposes other than remedying health state, unspecified: Secondary | ICD-10-CM | POA: Diagnosis not present

## 2021-10-07 DIAGNOSIS — Z419 Encounter for procedure for purposes other than remedying health state, unspecified: Secondary | ICD-10-CM | POA: Diagnosis not present

## 2021-10-29 ENCOUNTER — Encounter: Payer: Self-pay | Admitting: Family

## 2021-10-29 ENCOUNTER — Ambulatory Visit: Payer: Medicaid Other | Admitting: Family

## 2021-10-29 VITALS — BP 129/65 | HR 82 | Temp 98.0°F | Ht 65.0 in | Wt 202.4 lb

## 2021-10-29 DIAGNOSIS — Z113 Encounter for screening for infections with a predominantly sexual mode of transmission: Secondary | ICD-10-CM | POA: Diagnosis not present

## 2021-10-29 NOTE — Patient Instructions (Signed)
Preventing Sexually Transmitted Infections, Adult  Sexually transmitted infections (STIs) are diseases that are spread from person to person (are contagious). They are spread, or transmitted, through bodily fluids exchanged during sex or sexual contact. These bodily fluids include saliva, semen, blood, vaginal mucus, and urine. STIs are very common among people of all ages.  Some common STIs include:  Herpes.  Hepatitis B.  Chlamydia.  Gonorrhea.  Syphilis.  HPV (human papillomavirus).  HIV, also called the human immunodeficiency virus. This is the virus that can cause AIDS (acquired immunodeficiency syndrome).  Often, people who have these STIs do not have symptoms. Even without symptoms, these infections can be spread from person to person and require treatment.  How can these conditions affect me?  STIs can be treated, and many STIs can be cured. However, some STIs cannot be cured and will affect you for the rest of your life.  Certain STIs may:  Require you to take medicine for the rest of your life.  Affect your ability to have children (your fertility).  Increase your risk for developing another STI or certain serious health conditions. These may include:  Cervical cancer.  Head and neck cancer.  Pelvic inflammatory disease (PID), in women.  Organ damage or damage to other parts of your body, if the infection spreads.  Cause problems during pregnancy and may be transmitted to the baby during the pregnancy or childbirth.  What can increase my risk?  You may have an increased risk for developing an STI if:  You have unprotected sex. Sex includes oral, vaginal, or anal sex.  You have more than one sex partner.  You have a sex partner who has multiple sex partners.  You have sex with someone who has an STI.  You have an STI or you had an STI before.  You inject drugs or have a sex partner or partners who inject drugs.  What actions can I take to prevent STIs?  The only way to completely prevent STIs is not to have  sex of any kind. This is called practicing abstinence. If you are sexually active, you can protect yourself and others by taking these actions to lower your risk of getting an STI:  Lifestyle  Avoid mixing alcohol, drugs, and sex. Alcohol and drug use can affect your ability to make good decisions and can lead to risky sexual behaviors.  Medicines  Ask your health care provider about taking pre-exposure prophylaxis (PrEP) to prevent HIV infection.  General information    Stay up to date on vaccinations. Certain vaccines can lower your risk of getting certain STIs, such as:  Hepatitis A and hepatitis B vaccines. You may have been vaccinated as a young child, but you will likely need a booster shot as a teen or young adult.  HPV (human papillomavirus) vaccine.  Have only one sex partner (be monogamous) or limit the number of sex partners you have.  Use methods that prevent the exchange of body fluids between partners (barrier protection) correctly every time you have sex. Barrier protection can be used during oral, vaginal, or anal sex. Commonly used barrier methods include:  Female condom.  Female condom.  Dental dam.  Use a new condom for every sex act from start to finish.  Get tested for STIs. Have your partners get tested, too.  If you test positive for an STI, follow recommendations from your health care provider about treatment and make sure your sex partners are tested and treated.  Birth control   pills, injections, implants, and intrauterine devices (IUDs) do not protect against STIs. To prevent both STIs and pregnancy, always use a condom with another form of birth control.  Some STIs, such as herpes, are spread through skin-to-skin contact. A condom may not protect you from getting such STIs. Avoid all sexual contact if you or your partners have herpes and there is an active flare with open sores.  Where to find more information  Learn more about STIs from:  Centers for Disease Control and Prevention:  More  information about specific STIs: cdc.gov/std  Places to get sexual health counseling and treatment for free or at a low cost: gettested.cdc.gov  [place] Department of Health and Human Services: www.womenshealth.gov  Summary  Sexually transmitted infections (STIs) can spread through exchanging bodily fluids during sexual contact. Fluids include saliva, semen, blood, vaginal mucus, and urine.  You may have an increased risk for developing an STI if you have unprotected sex. Sex includes oral, vaginal, or anal sex.  If you do have sex, limit your number of sex partners and use barrier protection every time you have sex.  This information is not intended to replace advice given to you by your health care provider. Make sure you discuss any questions you have with your health care provider.  Document Revised: 12/09/2019 Document Reviewed: 12/09/2019  Elsevier Patient Education  2022 Elsevier Inc.

## 2021-10-29 NOTE — Addendum Note (Signed)
Addended by: Ignacia Bayley on: 10/29/2021 04:43 PM   Modules accepted: Orders

## 2021-10-29 NOTE — Addendum Note (Signed)
Addended by: Ignacia Bayley on: 10/29/2021 04:36 PM   Modules accepted: Orders

## 2021-10-29 NOTE — Progress Notes (Signed)
° °  Subjective:    Patient ID: Vickie Dillon, female    DOB: 2001-06-24, 20 y.o.   MRN: [id number]  Chief Complaint  Patient presents with   STD Check     HPI Pt presents to the office today for STD check. She reports she has had sex with new partners and wants to be tested. Denies any vaginal discharge, pain, or fevers.   She has a pap completed 04/21/21.   Review of Systems  All other systems reviewed and are negative.     Objective:   Physical Exam Vitals reviewed.  Constitutional:      General: She is not in acute distress.    Appearance: She is well-developed. She is obese.  HENT:     Head: Normocephalic and atraumatic.  Eyes:     Pupils: Pupils are equal, round, and reactive to light.  Neck:     Thyroid: No thyromegaly.  Cardiovascular:     Rate and Rhythm: Normal rate and regular rhythm.     Heart sounds: Normal heart sounds. No murmur heard. Pulmonary:     Effort: Pulmonary effort is normal. No respiratory distress.     Breath sounds: Normal breath sounds. No wheezing.  Abdominal:     General: Bowel sounds are normal. There is no distension.     Palpations: Abdomen is soft.     Tenderness: There is no abdominal tenderness.  Musculoskeletal:        General: No tenderness. Normal range of motion.     Cervical back: Normal range of motion and neck supple.  Skin:    General: Skin is warm and dry.  Neurological:     Mental Status: She is alert and oriented to person, place, and time.     Cranial Nerves: No cranial nerve deficit.     Deep Tendon Reflexes: Reflexes are normal and symmetric.  Psychiatric:        Behavior: Behavior normal.        Thought Content: Thought content normal.        Judgment: Judgment normal.     BP 129/65    Pulse 82    Temp 98 F (36.7 C) (Temporal)    Ht 5\' 5"  (1.651 m)    Wt 202 lb 6.4 oz (91.8 kg)    BMI 33.68 kg/m       Assessment & Plan:  Krysti Hickling comes in today with chief complaint of STD Check    Diagnosis and  orders addressed:  1. Screen for STD (sexually transmitted disease) Safe sex  Labs pending  Follow up if symptoms worsen or do not imprve  - RPR - HepB+HepC+HIV Panel - HSV(herpes simplex vrs) 1+2 ab-IgG - Urine cytology ancillary only   Vickie Slack, FNP

## 2021-11-02 LAB — CT, NG, MYCOPLASMAS NAA, URINE
Chlamydia trachomatis, NAA: NEGATIVE
Mycoplasma genitalium NAA: NEGATIVE
Mycoplasma hominis NAA: POSITIVE — AB
Neisseria gonorrhoeae, NAA: NEGATIVE
Ureaplasma spp NAA: POSITIVE — AB

## 2021-11-02 LAB — HEPB+HEPC+HIV PANEL
HIV Screen 4th Generation wRfx: NONREACTIVE
Hep B C IgM: NEGATIVE
Hep B Core Total Ab: NEGATIVE
Hep B E Ab: NEGATIVE
Hep B E Ag: NEGATIVE
Hep B Surface Ab, Qual: NONREACTIVE
Hep C Virus Ab: 0.2 s/co ratio (ref 0.0–0.9)
Hepatitis B Surface Ag: NEGATIVE

## 2021-11-02 LAB — HSV(HERPES SIMPLEX VRS) I + II AB-IGG
HSV 1 Glycoprotein G Ab, IgG: <0.91 index (ref 0.00–0.90)
HSV 2 IgG, Type Spec: <0.91 index (ref 0.00–0.90)

## 2021-11-02 LAB — RPR: RPR Ser Ql: NONREACTIVE

## 2021-11-07 DIAGNOSIS — Z419 Encounter for procedure for purposes other than remedying health state, unspecified: Secondary | ICD-10-CM | POA: Diagnosis not present

## 2021-11-12 ENCOUNTER — Telehealth: Payer: Self-pay | Admitting: Family Medicine

## 2021-11-12 NOTE — Telephone Encounter (Signed)
Pt aware of labs  

## 2021-11-12 NOTE — Telephone Encounter (Signed)
lmtcb

## 2021-12-07 ENCOUNTER — Encounter: Payer: Self-pay | Admitting: *Deleted

## 2021-12-07 ENCOUNTER — Encounter: Payer: Self-pay | Admitting: Nurse Practitioner

## 2021-12-07 ENCOUNTER — Ambulatory Visit: Payer: Medicaid Other | Admitting: Nurse Practitioner

## 2021-12-07 VITALS — BP 110/67 | HR 58 | Temp 98.9°F | Ht 65.0 in | Wt 199.0 lb

## 2021-12-07 DIAGNOSIS — M5489 Other dorsalgia: Secondary | ICD-10-CM | POA: Diagnosis not present

## 2021-12-07 MED ORDER — CYCLOBENZAPRINE HCL 5 MG PO TABS: 5.0000 mg | ORAL_TABLET | Freq: Three times a day (TID) | ORAL | 1 refills | Status: DC | PRN

## 2021-12-07 MED ORDER — IBUPROFEN 600 MG PO TABS: 600.0000 mg | ORAL_TABLET | Freq: Three times a day (TID) | ORAL | 0 refills | Status: DC | PRN

## 2021-12-07 NOTE — Patient Instructions (Signed)

## 2021-12-07 NOTE — Progress Notes (Signed)
Acute Office Visit  Subjective:    Patient ID: Vickie Dillon, female    DOB: May 25, 2001, 21 y.o.   MRN: [id number]  Chief Complaint  Patient presents with   Back Pain    Back Pain This is a new problem. The problem occurs constantly. The problem is unchanged. The pain is present in the lumbar spine. The quality of the pain is described as aching. The pain does not radiate. The pain is moderate. Pertinent negatives include no abdominal pain, bowel incontinence, fever or pelvic pain.    Past Medical History:  Diagnosis Date   ADHD    Allergy    Anxiety    Depression     Past Surgical History:  Procedure Laterality Date   TONSILLECTOMY AND ADENOIDECTOMY  2006   TYMPANOSTOMY TUBE PLACEMENT Bilateral 2006    Family History  Problem Relation Age of Onset   Hypertension Mother    Hypothyroidism Mother    Depression Mother    Anxiety disorder Mother    Bipolar disorder Mother    Hyperlipidemia Mother    Migraines Mother    Multiple sclerosis Father    Depression Father    Bipolar disorder Father    Hypertension Father    Anxiety disorder Maternal Grandmother    Hyperlipidemia Maternal Grandfather    Hypertension Maternal Grandfather    Nephrolithiasis Maternal Grandfather    Diabetes Paternal Grandmother    Diabetes Paternal Grandfather    Heart attack Paternal Grandfather    Anxiety disorder Half-Brother    Migraines Half-Sister    Bipolar disorder Half-Sister    Anxiety disorder Half-Sister     Social History   Socioeconomic History   Marital status: Single    Spouse name: Not on file   Number of children: Not on file   Years of education: Not on file   Highest education level: Not on file  Occupational History   Occupation: Student     Comment: [employer], psychology  Tobacco Use   Smoking status: Never   Smokeless tobacco: Never  Vaping Use   Vaping Use: Some days  Substance and Sexual Activity   Alcohol use: Not Currently    Comment: occ   Drug use:  Not Currently   Sexual activity: Yes    Birth control/protection: Condom  Other Topics Concern   Not on file  Social History Narrative   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  She has a boyfriend.   Social Determinants of Health   Financial Resource Strain: Not on file  Food Insecurity: Not on file  Transportation Needs: Not on file  Physical Activity: Not on file  Stress: Not on file  Social Connections: Not on file  Intimate Partner Violence: Not on file    Outpatient Medications Prior to Visit  Medication Sig Dispense Refill   CONCERTA 27 MG CR tablet Take 27 mg by mouth daily.     No facility-administered medications prior to visit.     Review of Systems  Constitutional:  Negative for appetite change, chills, fatigue and fever.  HENT: Negative.  Negative for sinus pressure and sinus pain.   Cardiovascular: Negative.   Gastrointestinal: Negative.  Negative for abdominal pain and bowel incontinence.  Genitourinary:  Negative for pelvic pain.  Musculoskeletal:  Positive for back pain.  All other systems reviewed and are negative.     Objective:    Physical Exam Vitals and nursing note reviewed.  Constitutional:  Appearance: Normal appearance.  HENT:     Right Ear: External ear normal.     Left Ear: External ear normal.  Eyes:     Conjunctiva/sclera: Conjunctivae normal.  Pulmonary:     Effort: Pulmonary effort is normal.     Breath sounds: Normal breath sounds.  Abdominal:     General: Bowel sounds are normal.  Musculoskeletal:     Thoracic back: Tenderness present. Decreased range of motion.     Lumbar back: Tenderness present.  Neurological:     Mental Status: She is alert.    BP 110/67    Pulse (!) 58    Temp 98.9 F (37.2 C)    Ht 5\' 5"  (1.651 m)    Wt 199 lb (90.3 kg)    LMP 12/05/2021    SpO2 97%    BMI 33.12 kg/m  Wt Readings from Last 3 Encounters:  12/07/21 199 lb (90.3 kg)  10/29/21 202 lb 6.4 oz (91.8 kg)   08/05/21 199 lb (90.3 kg)    Health Maintenance Due  Topic Date Due   COVID-19 Vaccine (2 - Moderna series) 09/13/2021    There are no preventive care reminders to display for this patient.      Assessment & Plan:   Back pain not well controlled in the past 3 weeks.  No numbness or tingling bilateral upper extremity. Patient suspects epidural during childbirth over 1 year ago and or a size triple D breast may be causing her back pain. I advised patient to take medication as prescribed, back strengthening exercises, If symptoms not resolved it will be a good idea to reassess with surgery to examine her breast size and contributing factors affecting back pain.  Patient verbalized understanding.  Rx sent to pharmacy.  Printed handouts given.  Problem List Items Addressed This Visit   None Visit Diagnoses     Back pain without sciatica    -  Primary   Relevant Medications   ibuprofen (ADVIL) 600 MG tablet   cyclobenzaprine (FLEXERIL) 5 MG tablet        Meds ordered this encounter  Medications   ibuprofen (ADVIL) 600 MG tablet    Sig: Take 1 tablet (600 mg total) by mouth every 8 (eight) hours as needed.    Dispense:  30 tablet    Refill:  0    Order Specific Question:   Supervising Provider    Answer:   13/05/2021 Mechele Claude   cyclobenzaprine (FLEXERIL) 5 MG tablet    Sig: Take 1 tablet (5 mg total) by mouth 3 (three) times daily as needed for muscle spasms.    Dispense:  30 tablet    Refill:  1    Order Specific Question:   Supervising Provider    Answer:   [[id number]]     Standley Brooking, NP

## 2021-12-08 DIAGNOSIS — Z419 Encounter for procedure for purposes other than remedying health state, unspecified: Secondary | ICD-10-CM | POA: Diagnosis not present

## 2021-12-13 DIAGNOSIS — J029 Acute pharyngitis, unspecified: Secondary | ICD-10-CM | POA: Diagnosis not present

## 2021-12-13 DIAGNOSIS — J329 Chronic sinusitis, unspecified: Secondary | ICD-10-CM | POA: Diagnosis not present

## 2021-12-14 ENCOUNTER — Encounter: Payer: Self-pay | Admitting: Nurse Practitioner

## 2021-12-14 ENCOUNTER — Telehealth (INDEPENDENT_AMBULATORY_CARE_PROVIDER_SITE_OTHER): Payer: Medicaid Other | Admitting: Nurse Practitioner

## 2021-12-14 DIAGNOSIS — J040 Acute laryngitis: Secondary | ICD-10-CM | POA: Insufficient documentation

## 2021-12-14 MED ORDER — PREDNISONE 10 MG (21) PO TBPK
ORAL_TABLET | ORAL | 0 refills | Status: DC
Start: 2021-12-14 — End: 2022-01-06

## 2021-12-14 MED ORDER — DM-GUAIFENESIN ER 30-600 MG PO TB12: 1.0000 | ORAL_TABLET | Freq: Two times a day (BID) | ORAL | 0 refills | Status: DC

## 2021-12-14 MED ORDER — SALINE SPRAY 0.65 % NA SOLN: 1.0000 | NASAL | 5 refills | Status: DC | PRN

## 2021-12-14 NOTE — Progress Notes (Signed)
° °  Virtual Visit  Note Due to COVID-19 pandemic this visit was conducted virtually. This visit type was conducted due to national recommendations for restrictions regarding the COVID-19 Pandemic (e.g. social distancing, sheltering in place) in an effort to limit this patient's exposure and mitigate transmission in our community. All issues noted in this document were discussed and addressed.  A physical exam was not performed with this format.  I connected with Vickie Dillon on 12/14/21 at 12:30 PM by telephone and verified that I am speaking with the correct person using two identifiers. Vickie Dillon is currently located at home during visit. The provider, Ivy Lynn, NP is located in their office at time of visit.  I discussed the limitations, risks, security and privacy concerns of performing an evaluation and management service by telephone and the availability of in person appointments. I also discussed with the patient that there may be a patient responsible charge related to this service. The patient expressed understanding and agreed to proceed.   History and Present Illness:  URI  This is a new problem. The problem has been unchanged. There has been no fever. Associated symptoms include coughing and a sore throat. Pertinent negatives include no chest pain, nausea or rash.     Review of Systems  Constitutional:  Negative for chills, fever and malaise/fatigue.  HENT:  Positive for sore throat.   Respiratory:  Positive for cough.   Cardiovascular:  Negative for chest pain.  Gastrointestinal:  Negative for nausea.  Genitourinary: Negative.   Skin:  Negative for rash.  Neurological: Negative.   All other systems reviewed and are negative.   Observations/Objective: Televisit patient not in distress.  Assessment and Plan: Patient presents with symptoms of acute  Take meds as prescribed - Use a cool mist humidifier  -Use saline nose sprays frequently -Force fluids -For  fever or aches or pains- take Tylenol or ibuprofen. -Prednisone taper -Guaifenesin for cough and congestion -COVID-19 test negative, strep completed negative by pharmacy   Follow Up Instructions: Follow-up for worsening present symptoms.    I discussed the assessment and treatment plan with the patient. The patient was provided an opportunity to ask questions and all were answered. The patient agreed with the plan and demonstrated an understanding of the instructions.   The patient was advised to call back or seek an in-person evaluation if the symptoms worsen or if the condition fails to improve as anticipated.  The above assessment and management plan was discussed with the patient. The patient verbalized understanding of and has agreed to the management plan. Patient is aware to call the clinic if symptoms persist or worsen. Patient is aware when to return to the clinic for a follow-up visit. Patient educated on when it is appropriate to go to the emergency department.   Time call ended: 12:40 PM  I provided 10 minutes of  non face-to-face time during this encounter.    Ivy Lynn, NP

## 2022-01-04 ENCOUNTER — Ambulatory Visit: Payer: Medicaid Other | Admitting: Nurse Practitioner

## 2022-01-05 ENCOUNTER — Encounter: Payer: Self-pay | Admitting: Family Medicine

## 2022-01-05 DIAGNOSIS — Z419 Encounter for procedure for purposes other than remedying health state, unspecified: Secondary | ICD-10-CM | POA: Diagnosis not present

## 2022-01-06 ENCOUNTER — Ambulatory Visit: Payer: Medicaid Other | Admitting: Family Medicine

## 2022-01-06 ENCOUNTER — Ambulatory Visit (INDEPENDENT_AMBULATORY_CARE_PROVIDER_SITE_OTHER): Payer: Medicaid Other

## 2022-01-06 ENCOUNTER — Encounter: Payer: Self-pay | Admitting: Family Medicine

## 2022-01-06 VITALS — BP 105/63 | HR 85 | Temp 97.7°F | Ht 65.0 in | Wt 197.2 lb

## 2022-01-06 DIAGNOSIS — M549 Dorsalgia, unspecified: Secondary | ICD-10-CM | POA: Diagnosis not present

## 2022-01-06 DIAGNOSIS — F909 Attention-deficit hyperactivity disorder, unspecified type: Secondary | ICD-10-CM

## 2022-01-06 DIAGNOSIS — N62 Hypertrophy of breast: Secondary | ICD-10-CM

## 2022-01-06 DIAGNOSIS — Z202 Contact with and (suspected) exposure to infections with a predominantly sexual mode of transmission: Secondary | ICD-10-CM

## 2022-01-06 DIAGNOSIS — N898 Other specified noninflammatory disorders of vagina: Secondary | ICD-10-CM

## 2022-01-06 DIAGNOSIS — J029 Acute pharyngitis, unspecified: Secondary | ICD-10-CM | POA: Diagnosis not present

## 2022-01-06 DIAGNOSIS — M546 Pain in thoracic spine: Secondary | ICD-10-CM | POA: Diagnosis not present

## 2022-01-06 DIAGNOSIS — A599 Trichomoniasis, unspecified: Secondary | ICD-10-CM

## 2022-01-06 LAB — WET PREP FOR TRICH, YEAST, CLUE
Clue Cell Exam: NEGATIVE
Trichomonas Exam: POSITIVE — AB
Yeast Exam: NEGATIVE

## 2022-01-06 LAB — RAPID STREP SCREEN (MED CTR MEBANE ONLY): Strep Gp A Ag, IA W/Reflex: NEGATIVE

## 2022-01-06 LAB — CULTURE, GROUP A STREP

## 2022-01-06 MED ORDER — METHOCARBAMOL 500 MG PO TABS
500.0000 mg | ORAL_TABLET | Freq: Three times a day (TID) | ORAL | 0 refills | Status: DC | PRN
Start: 2022-01-06 — End: 2022-06-23

## 2022-01-06 MED ORDER — METRONIDAZOLE 500 MG PO TABS: 500.0000 mg | ORAL_TABLET | Freq: Two times a day (BID) | ORAL | 0 refills | Status: AC

## 2022-01-06 MED ORDER — NAPROXEN 500 MG PO TABS: 500.0000 mg | ORAL_TABLET | Freq: Two times a day (BID) | ORAL | 0 refills | Status: DC

## 2022-01-06 NOTE — Progress Notes (Signed)
? ?Assessment & Plan:  ?1. Acute midline thoracic back pain ?Encouraged use of NSAID, muscle relaxer, muscle rub, and heating pad.  ?- DG Thoracic Spine 2 View ?- naproxen (NAPROSYN) 500 MG tablet; Take 1 tablet (500 mg total) by mouth 2 (two) times daily with a meal.  Dispense: 30 tablet; Refill: 0 ? ?2. Musculoskeletal back pain ?Encouraged use of NSAID, muscle relaxer, muscle rub, and heating pad. Education provided on muscle pain. ?- methocarbamol (ROBAXIN) 500 MG tablet; Take 1 tablet (500 mg total) by mouth every 8 (eight) hours as needed for muscle spasms.  Dispense: 30 tablet; Refill: 0 ?- naproxen (NAPROSYN) 500 MG tablet; Take 1 tablet (500 mg total) by mouth 2 (two) times daily with a meal.  Dispense: 30 tablet; Refill: 0 ? ?3. Large breasts ?Patient is going to continue thinking about a breast reduction.  ? ?4. Vagina itching ?- WET PREP FOR TRICH, YEAST, CLUE ?- NuSwab Vaginitis Plus (VG+) ? ?5. Trichomoniasis ?Education provided on trichomoniasis. Advised to avoid all alcohol while taking Flagyl.  ?- metroNIDAZOLE (FLAGYL) 500 MG tablet; Take 1 tablet (500 mg total) by mouth 2 (two) times daily for 7 days.  Dispense: 14 tablet; Refill: 0 ? ?6. Exposure to sexually transmitted disease (STD) ?- NuSwab Vaginitis Plus (VG+) ?- HepB+HepC+HIV Panel ?- RPR ? ?7. Sore throat ?Discussed symptom management. Suspect viral infection. ?- COVID-19, Flu A+B and RSV ?- Culture, Group A Strep ?- Rapid Strep Screen ([facility] ONLY) ? ?8. Attention deficit hyperactivity disorder (ADHD), unspecified ADHD type ?Encouraged patient to schedule an appointment with her PCP as they have never established an agreement on prescribing Concerta.  ? ? ?Follow up plan: Return if symptoms worsen or fail to improve. ? ?Hendricks Limes, MSN, APRN, FNP-C ?[place] ? ?Subjective:  ? ?Patient ID: Sayward Foutz, female    DOB: Aug 14, 2001, 21 y.o.   MRN: [id number] ? ?HPI: ?Fraida Rademacher is a 21 y.o. female  presenting on 01/06/2022 for Back Pain (Patient was seen for back pain on 1/31 and states it is no better) and Vaginal Itching (X 1 week ) ? ?Patient reports upper and middle back pain that has been worse over the past few weeks. Hurts more at night, but has been occurring more throughout the day. Describes pain as throbbing, aching, and sharp depending on the day. Rates pain 7/10 on average. Pain is worsened when she bends her head down or uses more than one pillow. Pain improves when she is at the gym. Previous treatments include muscle relaxer, which was not helpful. She discussed her large breasts being a contributing factor to her back pain at her last visit, but isn't sure she wants to proceed yet with a breast reduction.  ? ?Patient reports vaginal itching x1 week. No discharge or lesions. She has had a new sex partner approximately one month ago.  ? ?Patient complains of headache and sore throat. Onset of symptoms was 1 day ago, gradually worsening since that time. She is drinking plenty of fluids. Evaluation to date: none. Treatment to date: none.  ? ? ?ROS: Negative unless specifically indicated above in HPI.  ? ?Relevant past medical history reviewed and updated as indicated.  ? ?Allergies and medications reviewed and updated. ? ? ?Current Outpatient Medications:  ?  CONCERTA 27 MG CR tablet, Take 27 mg by mouth daily., Disp: , Rfl:  ?  cyclobenzaprine (FLEXERIL) 5 MG tablet, Take 1 tablet (5 mg total) by mouth 3 (three) times daily  as needed for muscle spasms., Disp: 30 tablet, Rfl: 1 ?  ibuprofen (ADVIL) 600 MG tablet, Take 1 tablet (600 mg total) by mouth every 8 (eight) hours as needed., Disp: 30 tablet, Rfl: 0 ? ?No Known Allergies ? ?Objective:  ? ?BP 105/63   Pulse 85   Temp 97.7 ?F (36.5 ?C) (Temporal)   Ht 5\' 5"  (1.651 m)   Wt 197 lb 3.2 oz (89.4 kg)   SpO2 97%   BMI 32.82 kg/m?   ? ?Physical Exam ?Vitals reviewed.  ?Constitutional:   ?   General: She is not in acute distress. ?   Appearance:  Normal appearance. She is obese. She is not ill-appearing, toxic-appearing or diaphoretic.  ?HENT:  ?   Head: Normocephalic and atraumatic.  ?   Mouth/Throat:  ?   Mouth: Mucous membranes are moist.  ?   Pharynx: Oropharynx is clear. Posterior oropharyngeal erythema present. No oropharyngeal exudate.  ?Eyes:  ?   General: No scleral icterus.    ?   Right eye: No discharge.     ?   Left eye: No discharge.  ?   Conjunctiva/sclera: Conjunctivae normal.  ?Cardiovascular:  ?   Rate and Rhythm: Normal rate.  ?Pulmonary:  ?   Effort: Pulmonary effort is normal. No respiratory distress.  ?Musculoskeletal:     ?   General: Normal range of motion.  ?   Cervical back: Normal range of motion.  ?   Thoracic back: Tenderness (on both sides between spine and shoulder blades) and bony tenderness present. No swelling, edema, deformity, signs of trauma, lacerations or spasms. Normal range of motion. No scoliosis.  ?Skin: ?   General: Skin is warm and dry.  ?   Capillary Refill: Capillary refill takes less than 2 seconds.  ?Neurological:  ?   General: No focal deficit present.  ?   Mental Status: She is alert and oriented to person, place, and time. Mental status is at baseline.  ?Psychiatric:     ?   Mood and Affect: Mood normal.     ?   Behavior: Behavior normal.     ?   Thought Content: Thought content normal.     ?   Judgment: Judgment normal.  ? ? ? ? ? ? ?

## 2022-01-06 NOTE — Patient Instructions (Signed)
Heating pad ?Muscle rub such as Biofreeze ?Muscle relaxer (Robaxin) ?NSAID (Naproxen) ?

## 2022-01-07 LAB — COVID-19, FLU A+B AND RSV
Influenza A, NAA: NOT DETECTED
Influenza B, NAA: NOT DETECTED
RSV, NAA: NOT DETECTED
SARS-CoV-2, NAA: NOT DETECTED

## 2022-01-07 LAB — HEPB+HEPC+HIV PANEL
HIV Screen 4th Generation wRfx: NONREACTIVE
Hep B C IgM: NEGATIVE
Hep B Core Total Ab: NEGATIVE
Hep B E Ab: NEGATIVE
Hep B E Ag: NEGATIVE
Hep B Surface Ab, Qual: NONREACTIVE
Hep C Virus Ab: NONREACTIVE
Hepatitis B Surface Ag: NEGATIVE

## 2022-01-07 LAB — RPR: RPR Ser Ql: NONREACTIVE

## 2022-01-09 LAB — CULTURE, GROUP A STREP: Strep A Culture: NEGATIVE

## 2022-01-10 DIAGNOSIS — Z01419 Encounter for gynecological examination (general) (routine) without abnormal findings: Secondary | ICD-10-CM | POA: Diagnosis not present

## 2022-01-10 LAB — NUSWAB VAGINITIS PLUS (VG+)
Candida albicans, NAA: NEGATIVE
Candida glabrata, NAA: NEGATIVE
Chlamydia trachomatis, NAA: NEGATIVE
Megasphaera 1: HIGH Score — AB
Neisseria gonorrhoeae, NAA: NEGATIVE
Trich vag by NAA: POSITIVE — AB

## 2022-01-17 ENCOUNTER — Telehealth: Payer: Self-pay | Admitting: Family Medicine

## 2022-01-17 DIAGNOSIS — F411 Generalized anxiety disorder: Secondary | ICD-10-CM | POA: Diagnosis not present

## 2022-01-17 DIAGNOSIS — F902 Attention-deficit hyperactivity disorder, combined type: Secondary | ICD-10-CM | POA: Diagnosis not present

## 2022-01-17 DIAGNOSIS — F331 Major depressive disorder, recurrent, moderate: Secondary | ICD-10-CM | POA: Diagnosis not present

## 2022-01-17 NOTE — Telephone Encounter (Signed)
Doesn't have to (unless she is pregnant) but most certainly CAN have it done if she desires. ?

## 2022-01-17 NOTE — Telephone Encounter (Signed)
Vickie Dillon coverage, please advise.  Patient was treated for Trich with Flagyl 500 mg bid x 7 days. ?

## 2022-01-17 NOTE — Telephone Encounter (Signed)
Pt aware of provider feedback and she isn't pregnant so isn't going to retest. ?

## 2022-01-17 NOTE — Telephone Encounter (Signed)
Patient has finished the antibiotic that was given to her after taking the STD panel and she wants to know if she needs to come in to be retested. She finished taking the medicine on 3/10. Please call back and advise.  ?

## 2022-01-25 ENCOUNTER — Telehealth: Payer: Self-pay | Admitting: Family Medicine

## 2022-01-25 NOTE — Telephone Encounter (Signed)
POS for Trich/BV a couple weeks ago - took all of meds. No current S/S- just wanted confirmation that she was clear - aware yes ? ?

## 2022-02-05 DIAGNOSIS — Z419 Encounter for procedure for purposes other than remedying health state, unspecified: Secondary | ICD-10-CM | POA: Diagnosis not present

## 2022-03-07 DIAGNOSIS — Z419 Encounter for procedure for purposes other than remedying health state, unspecified: Secondary | ICD-10-CM | POA: Diagnosis not present

## 2022-03-16 DIAGNOSIS — F902 Attention-deficit hyperactivity disorder, combined type: Secondary | ICD-10-CM | POA: Diagnosis not present

## 2022-03-16 DIAGNOSIS — F411 Generalized anxiety disorder: Secondary | ICD-10-CM | POA: Diagnosis not present

## 2022-03-16 DIAGNOSIS — F331 Major depressive disorder, recurrent, moderate: Secondary | ICD-10-CM | POA: Diagnosis not present

## 2022-03-16 DIAGNOSIS — F419 Anxiety disorder, unspecified: Secondary | ICD-10-CM | POA: Diagnosis not present

## 2022-03-16 IMAGING — DX DG THORACIC SPINE 2V
3 series · 3 of 3 positions shown · non-contrast
Comparison: None.

CLINICAL DATA: Acute midline thoracic back pain. Thoracic back
pain.

EXAM:
THORACIC SPINE 2 VIEWS

[t-spine ap]
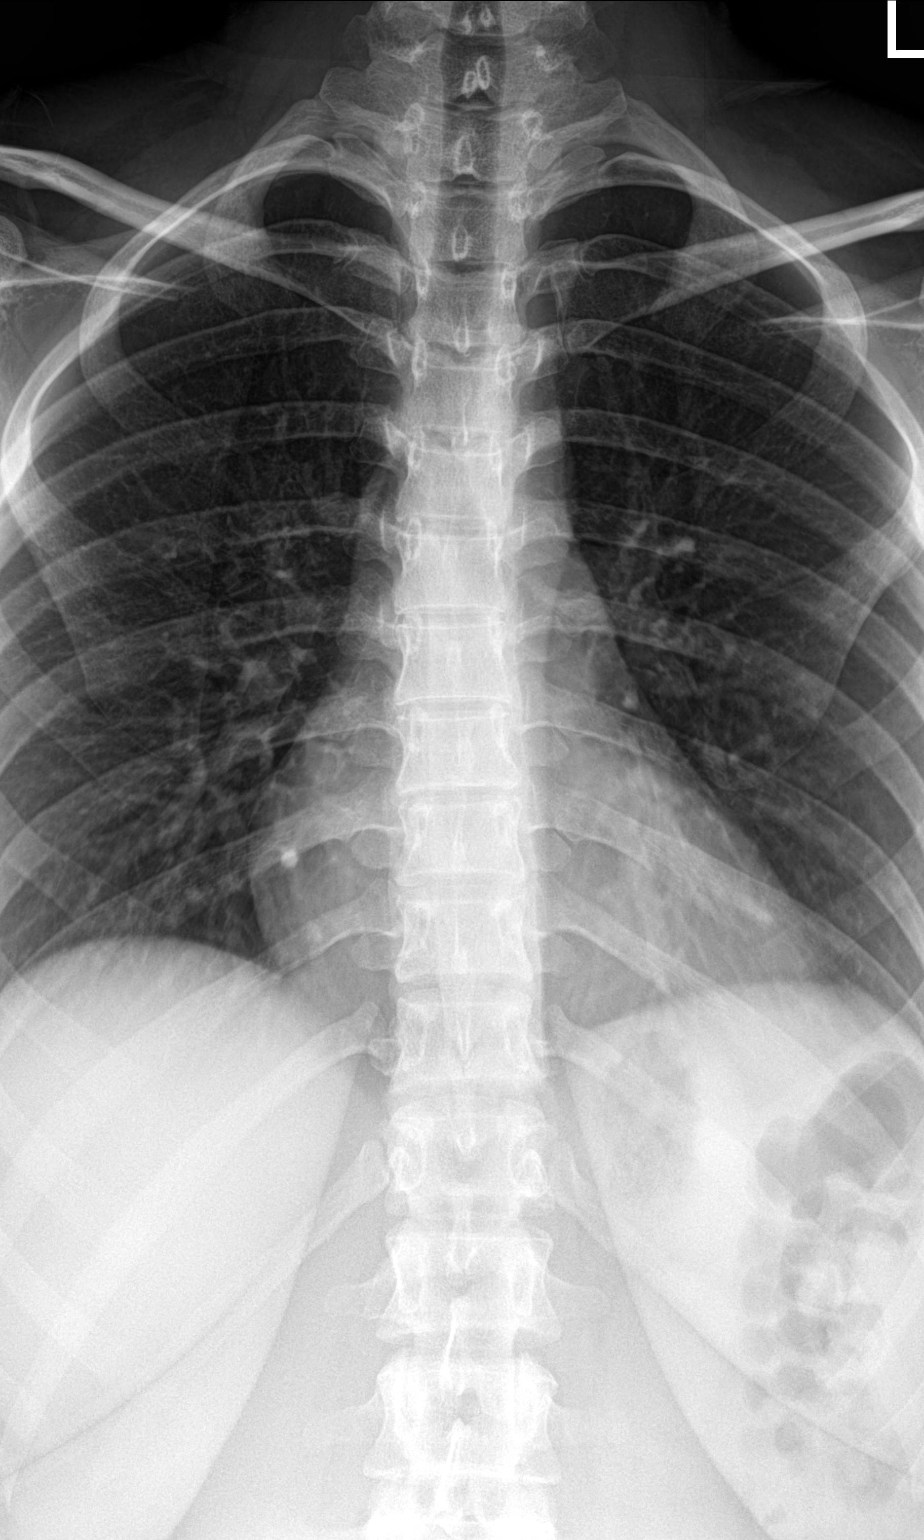

[t-spine lat]
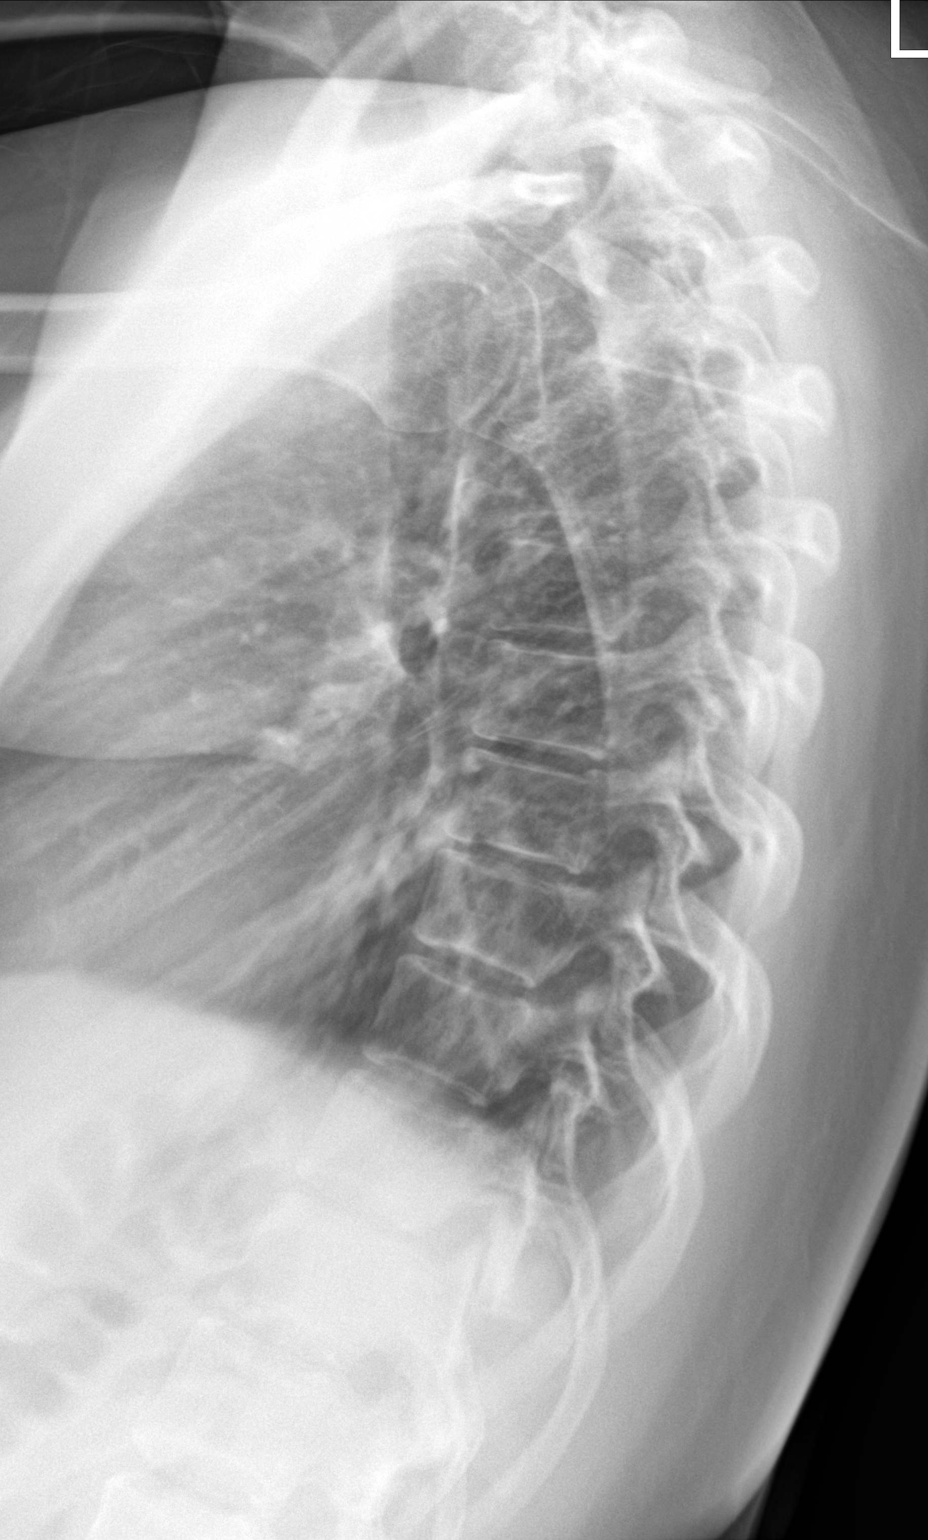

[t-spine lat swimmers]
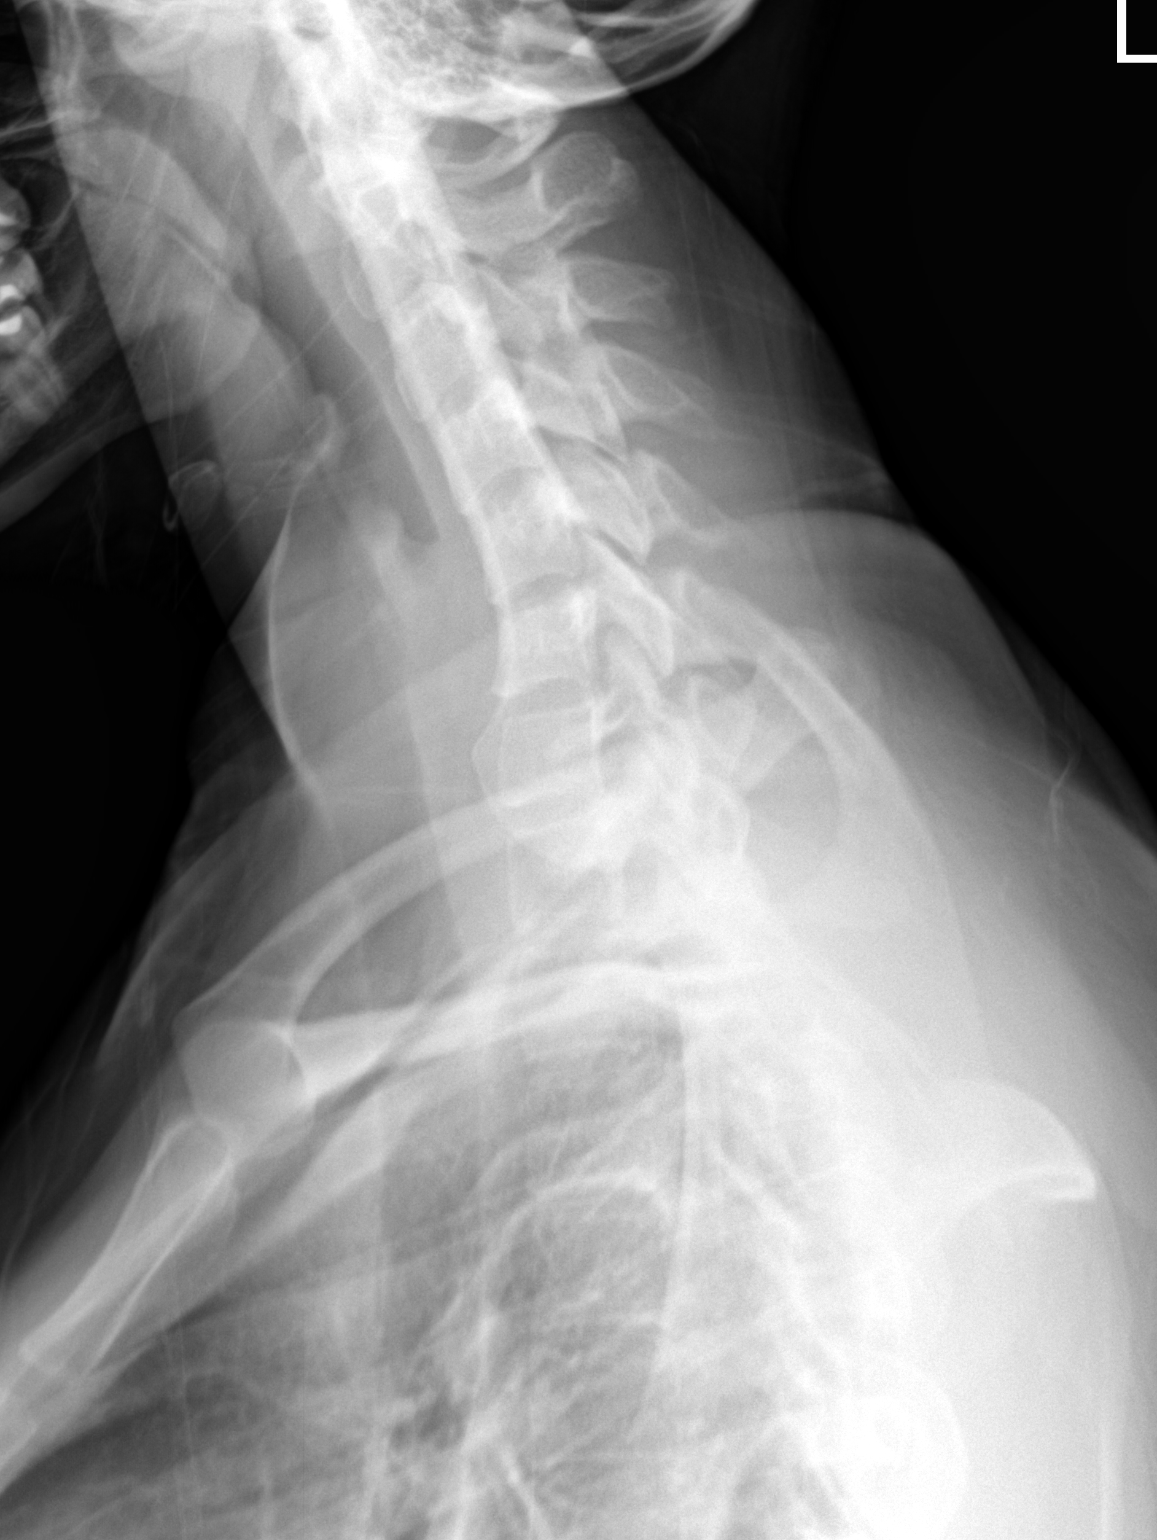

[3 of 3 positions shown; findings below may reference images not displayed]

FINDINGS: The alignment is maintained. Vertebral body heights are maintained.
No significant disc space narrowing. Posterior elements appear
intact. No evidence of fracture, focal bone lesion or bone
destruction. There is no paravertebral soft tissue abnormality.
IMPRESSION: Negative radiographs of the thoracic spine.

## 2022-04-07 DIAGNOSIS — F411 Generalized anxiety disorder: Secondary | ICD-10-CM | POA: Diagnosis not present

## 2022-04-07 DIAGNOSIS — Z419 Encounter for procedure for purposes other than remedying health state, unspecified: Secondary | ICD-10-CM | POA: Diagnosis not present

## 2022-04-07 DIAGNOSIS — F331 Major depressive disorder, recurrent, moderate: Secondary | ICD-10-CM | POA: Diagnosis not present

## 2022-04-07 DIAGNOSIS — F902 Attention-deficit hyperactivity disorder, combined type: Secondary | ICD-10-CM | POA: Diagnosis not present

## 2022-04-07 DIAGNOSIS — F419 Anxiety disorder, unspecified: Secondary | ICD-10-CM | POA: Diagnosis not present

## 2022-06-23 ENCOUNTER — Other Ambulatory Visit (HOSPITAL_COMMUNITY)
Admission: RE | Admit: 2022-06-23 | Discharge: 2022-06-23 | Disposition: A | Discharge status: Home / Self Care | Payer: Self-pay | Source: Ambulatory Visit | Attending: Nurse Practitioner | Admitting: Nurse Practitioner

## 2022-06-23 ENCOUNTER — Ambulatory Visit (INDEPENDENT_AMBULATORY_CARE_PROVIDER_SITE_OTHER): Payer: Self-pay | Admitting: Nurse Practitioner

## 2022-06-23 ENCOUNTER — Encounter: Payer: Self-pay | Admitting: Nurse Practitioner

## 2022-06-23 VITALS — BP 114/71 | HR 79 | Temp 97.9°F | Resp 20 | Ht 65.0 in | Wt 203.0 lb

## 2022-06-23 DIAGNOSIS — Z7251 High risk heterosexual behavior: Secondary | ICD-10-CM

## 2022-06-23 NOTE — Patient Instructions (Signed)
Safe Sex Practicing safe sex means taking steps before and during sex to reduce your risk of: Getting an STI (sexually transmitted infection). Giving your partner an STI. Unwanted or unplanned pregnancy. How to practice safe sex Ways you can practice safe sex  Limit your sexual partners to only one partner who is having sex with only you. Avoid using alcohol and drugs before having sex. Alcohol and drugs can affect your judgment. Before having sex with a new partner: Talk to your partner about past partners, past STIs, and drug use. Get screened for STIs and discuss the results with your partner. Ask your partner to get screened too. Check your body regularly for sores, blisters, rashes, or unusual discharge. If you notice any of these problems, visit your health care provider. Avoid sexual contact if you have symptoms of an infection or you are being treated for an STI. While having sex, use a condom. Make sure to: Use a condom every time you have vaginal, oral, or anal sex. Both females and males should wear condoms during oral sex. Keep condoms in place from the beginning to the end of sexual activity. Use a latex condom, if possible. Latex condoms offer the best protection. Use only water-based lubricants with a condom. Using petroleum-based lubricants or oils will weaken the condom and increase the chance that it will break. Ways your health care provider can help you practice safe sex  See your health care provider for regular screenings, exams, and tests for STIs. Talk with your health care provider about what kind of birth control (contraception) is best for you. Get vaccinated against hepatitis B and human papillomavirus (HPV). If you are at risk of being infected with HIV (human immunodeficiency virus), talk with your health care provider about taking a prescription medicine to prevent HIV infection. You are at risk for HIV if you: Are a man who has sex with other men. Are  sexually active with more than one partner. Take drugs by injection. Have a sex partner who has HIV. Have unprotected sex. Have sex with someone who has sex with both men and women. Have had an STI. Follow these instructions at home: Take over-the-counter and prescription medicines only as told by your health care provider. Keep all follow-up visits. This is important. Where to find more information Centers for Disease Control and Prevention: www.cdc.gov Planned Parenthood: www.plannedparenthood.org Office on Women's Health: www.womenshealth.gov Summary Practicing safe sex means taking steps before and during sex to reduce your risk getting an STI, giving your partner an STI, and having an unwanted or unplanned pregnancy. Before having sex with a new partner, talk to your partner about past partners, past STIs, and drug use. Use a condom every time you have vaginal, oral, or anal sex. Both females and males should wear condoms during oral sex. Check your body regularly for sores, blisters, rashes, or unusual discharge. If you notice any of these problems, visit your health care provider. See your health care provider for regular screenings, exams, and tests for STIs. This information is not intended to replace advice given to you by your health care provider. Make sure you discuss any questions you have with your health care provider. Document Revised: 03/30/2020 Document Reviewed: 03/30/2020 Elsevier Patient Education  2023 Elsevier Inc.  

## 2022-06-23 NOTE — Progress Notes (Signed)
   Subjective:    Patient ID: Vickie Dillon, female    DOB: [birth year], 21 y.o.   MRN: [id number]   Chief Complaint: STD check   HPI Pateint come sin to have STD panel. She just wants to make sure she doesnot have anything. Her babys dad has cheated in th epast and she wa with him recently. She denies any vaginal discharge or pelvic pain.     Review of Systems  Constitutional:  Negative for diaphoresis.  Eyes:  Negative for pain.  Respiratory:  Negative for shortness of breath.   Cardiovascular:  Negative for chest pain, palpitations and leg swelling.  Gastrointestinal:  Negative for abdominal pain.  Endocrine: Negative for polydipsia.  Skin:  Negative for rash.  Neurological:  Negative for dizziness, weakness and headaches.  Hematological:  Does not bruise/bleed easily.  All other systems reviewed and are negative.      Objective:   Physical Exam Constitutional:      Appearance: Normal appearance.  Cardiovascular:     Rate and Rhythm: Normal rate and regular rhythm.     Heart sounds: Normal heart sounds.  Pulmonary:     Effort: Pulmonary effort is normal.     Breath sounds: Normal breath sounds.  Skin:    General: Skin is warm and dry.  Neurological:     General: No focal deficit present.     Mental Status: She is alert and oriented to person, place, and time.  Psychiatric:        Mood and Affect: Mood normal.        Behavior: Behavior normal.    BP 114/71   Pulse 79   Temp 97.9 F (36.6 C) (Oral)   Resp 20   Ht 5\' 5"  (1.651 m)   Wt 203 lb (92.1 kg)   SpO2 98%   BMI 33.78 kg/m         Assessment & Plan:  in today with chief complaint of STD check   1. High risk heterosexual behavior Handout  on safe sex given - Urine cytology ancillary only - HepB+HepC+HIV Panel - HIV antibody (with reflex)    The above assessment and management plan was discussed with the patient. The patient verbalized understanding of and has agreed to the  management plan. Patient is aware to call the clinic if symptoms persist or worsen. Patient is aware when to return to the clinic for a follow-up visit. Patient educated on when it is appropriate to go to the emergency department.   Mary-Margaret Vickie Slack, FNP

## 2022-06-24 LAB — URINE CYTOLOGY ANCILLARY ONLY
Chlamydia: NEGATIVE
Comment: NEGATIVE
Comment: NEGATIVE
Comment: NORMAL
Neisseria Gonorrhea: NEGATIVE
Trichomonas: NEGATIVE

## 2022-06-24 LAB — HEPB+HEPC+HIV PANEL
HIV Screen 4th Generation wRfx: NONREACTIVE
Hep B C IgM: NEGATIVE
Hep B Core Total Ab: NEGATIVE
Hep B E Ab: NEGATIVE
Hep B E Ag: NEGATIVE
Hep B Surface Ab, Qual: NONREACTIVE
Hep C Virus Ab: NONREACTIVE
Hepatitis B Surface Ag: NEGATIVE

## 2023-01-09 DIAGNOSIS — Z3481 Encounter for supervision of other normal pregnancy, first trimester: Secondary | ICD-10-CM | POA: Diagnosis not present

## 2023-01-09 DIAGNOSIS — Z3A09 9 weeks gestation of pregnancy: Secondary | ICD-10-CM | POA: Diagnosis not present

## 2023-01-09 DIAGNOSIS — Z3687 Encounter for antenatal screening for uncertain dates: Secondary | ICD-10-CM | POA: Diagnosis not present

## 2023-01-09 DIAGNOSIS — O99211 Obesity complicating pregnancy, first trimester: Secondary | ICD-10-CM | POA: Diagnosis not present

## 2023-03-08 DIAGNOSIS — Z419 Encounter for procedure for purposes other than remedying health state, unspecified: Secondary | ICD-10-CM | POA: Diagnosis not present

## 2023-03-27 NOTE — Telephone Encounter (Signed)
Erroneous encounter will close.

## 2023-04-08 DIAGNOSIS — Z419 Encounter for procedure for purposes other than remedying health state, unspecified: Secondary | ICD-10-CM | POA: Diagnosis not present

## 2023-04-28 DIAGNOSIS — Z3A24 24 weeks gestation of pregnancy: Secondary | ICD-10-CM | POA: Diagnosis not present

## 2023-04-28 DIAGNOSIS — R109 Unspecified abdominal pain: Secondary | ICD-10-CM | POA: Diagnosis not present

## 2023-04-28 DIAGNOSIS — O2692 Pregnancy related conditions, unspecified, second trimester: Secondary | ICD-10-CM | POA: Diagnosis not present

## 2023-04-28 DIAGNOSIS — O368120 Decreased fetal movements, second trimester, not applicable or unspecified: Secondary | ICD-10-CM | POA: Diagnosis not present

## 2023-05-08 DIAGNOSIS — Z419 Encounter for procedure for purposes other than remedying health state, unspecified: Secondary | ICD-10-CM | POA: Diagnosis not present

## 2023-06-08 DIAGNOSIS — Z419 Encounter for procedure for purposes other than remedying health state, unspecified: Secondary | ICD-10-CM | POA: Diagnosis not present

## 2023-07-03 DIAGNOSIS — O99019 Anemia complicating pregnancy, unspecified trimester: Secondary | ICD-10-CM | POA: Diagnosis not present

## 2023-07-09 DIAGNOSIS — Z419 Encounter for procedure for purposes other than remedying health state, unspecified: Secondary | ICD-10-CM | POA: Diagnosis not present

## 2023-07-10 DIAGNOSIS — O99213 Obesity complicating pregnancy, third trimester: Secondary | ICD-10-CM | POA: Diagnosis not present

## 2023-07-10 DIAGNOSIS — O368130 Decreased fetal movements, third trimester, not applicable or unspecified: Secondary | ICD-10-CM | POA: Diagnosis not present

## 2023-07-10 DIAGNOSIS — Z3A35 35 weeks gestation of pregnancy: Secondary | ICD-10-CM | POA: Diagnosis not present

## 2023-07-17 DIAGNOSIS — Z3483 Encounter for supervision of other normal pregnancy, third trimester: Secondary | ICD-10-CM | POA: Diagnosis not present

## 2023-07-17 DIAGNOSIS — Z3685 Encounter for antenatal screening for Streptococcus B: Secondary | ICD-10-CM | POA: Diagnosis not present

## 2023-08-08 DIAGNOSIS — Z419 Encounter for procedure for purposes other than remedying health state, unspecified: Secondary | ICD-10-CM | POA: Diagnosis not present

## 2023-08-09 DIAGNOSIS — O411230 Chorioamnionitis, third trimester, not applicable or unspecified: Secondary | ICD-10-CM | POA: Diagnosis not present

## 2023-08-09 DIAGNOSIS — O99344 Other mental disorders complicating childbirth: Secondary | ICD-10-CM | POA: Diagnosis not present

## 2023-08-09 DIAGNOSIS — Z3483 Encounter for supervision of other normal pregnancy, third trimester: Secondary | ICD-10-CM | POA: Diagnosis not present

## 2023-08-09 DIAGNOSIS — R1084 Generalized abdominal pain: Secondary | ICD-10-CM | POA: Diagnosis not present

## 2023-08-09 DIAGNOSIS — F419 Anxiety disorder, unspecified: Secondary | ICD-10-CM | POA: Diagnosis not present

## 2023-08-09 DIAGNOSIS — O9081 Anemia of the puerperium: Secondary | ICD-10-CM | POA: Diagnosis not present

## 2023-08-09 DIAGNOSIS — Z3A39 39 weeks gestation of pregnancy: Secondary | ICD-10-CM | POA: Diagnosis not present

## 2023-08-09 DIAGNOSIS — D62 Acute posthemorrhagic anemia: Secondary | ICD-10-CM | POA: Diagnosis not present

## 2023-08-09 DIAGNOSIS — O26893 Other specified pregnancy related conditions, third trimester: Secondary | ICD-10-CM | POA: Diagnosis not present

## 2023-08-09 DIAGNOSIS — Z79899 Other long term (current) drug therapy: Secondary | ICD-10-CM | POA: Diagnosis not present

## 2023-08-09 DIAGNOSIS — F909 Attention-deficit hyperactivity disorder, unspecified type: Secondary | ICD-10-CM | POA: Diagnosis not present

## 2023-08-10 DIAGNOSIS — F419 Anxiety disorder, unspecified: Secondary | ICD-10-CM | POA: Diagnosis not present

## 2023-08-10 DIAGNOSIS — F32A Depression, unspecified: Secondary | ICD-10-CM | POA: Diagnosis not present

## 2023-08-10 DIAGNOSIS — Z3A39 39 weeks gestation of pregnancy: Secondary | ICD-10-CM | POA: Diagnosis not present

## 2023-08-14 DIAGNOSIS — Z3483 Encounter for supervision of other normal pregnancy, third trimester: Secondary | ICD-10-CM | POA: Diagnosis not present

## 2023-08-14 DIAGNOSIS — Z3482 Encounter for supervision of other normal pregnancy, second trimester: Secondary | ICD-10-CM | POA: Diagnosis not present

## 2023-09-08 DIAGNOSIS — Z419 Encounter for procedure for purposes other than remedying health state, unspecified: Secondary | ICD-10-CM | POA: Diagnosis not present

## 2023-09-15 DIAGNOSIS — Z124 Encounter for screening for malignant neoplasm of cervix: Secondary | ICD-10-CM | POA: Diagnosis not present

## 2023-09-15 DIAGNOSIS — Z113 Encounter for screening for infections with a predominantly sexual mode of transmission: Secondary | ICD-10-CM | POA: Diagnosis not present

## 2023-10-08 DIAGNOSIS — Z419 Encounter for procedure for purposes other than remedying health state, unspecified: Secondary | ICD-10-CM | POA: Diagnosis not present

## 2023-10-19 DIAGNOSIS — Z79899 Other long term (current) drug therapy: Secondary | ICD-10-CM | POA: Diagnosis not present

## 2023-10-19 DIAGNOSIS — L03116 Cellulitis of left lower limb: Secondary | ICD-10-CM | POA: Diagnosis not present

## 2023-10-29 DIAGNOSIS — R42 Dizziness and giddiness: Secondary | ICD-10-CM | POA: Diagnosis not present

## 2023-10-29 DIAGNOSIS — Z791 Long term (current) use of non-steroidal anti-inflammatories (NSAID): Secondary | ICD-10-CM | POA: Diagnosis not present

## 2023-10-29 DIAGNOSIS — R110 Nausea: Secondary | ICD-10-CM | POA: Diagnosis not present

## 2023-11-08 DIAGNOSIS — Z419 Encounter for procedure for purposes other than remedying health state, unspecified: Secondary | ICD-10-CM | POA: Diagnosis not present

## 2023-11-19 DIAGNOSIS — Z419 Encounter for procedure for purposes other than remedying health state, unspecified: Secondary | ICD-10-CM | POA: Diagnosis not present

## 2023-12-09 DIAGNOSIS — Z419 Encounter for procedure for purposes other than remedying health state, unspecified: Secondary | ICD-10-CM | POA: Diagnosis not present

## 2023-12-20 DIAGNOSIS — Z419 Encounter for procedure for purposes other than remedying health state, unspecified: Secondary | ICD-10-CM | POA: Diagnosis not present

## 2024-01-06 DIAGNOSIS — Z419 Encounter for procedure for purposes other than remedying health state, unspecified: Secondary | ICD-10-CM | POA: Diagnosis not present

## 2024-01-17 DIAGNOSIS — Z419 Encounter for procedure for purposes other than remedying health state, unspecified: Secondary | ICD-10-CM | POA: Diagnosis not present

## 2024-02-17 DIAGNOSIS — Z419 Encounter for procedure for purposes other than remedying health state, unspecified: Secondary | ICD-10-CM | POA: Diagnosis not present

## 2024-03-18 DIAGNOSIS — Z419 Encounter for procedure for purposes other than remedying health state, unspecified: Secondary | ICD-10-CM | POA: Diagnosis not present

## 2024-04-18 DIAGNOSIS — Z419 Encounter for procedure for purposes other than remedying health state, unspecified: Secondary | ICD-10-CM | POA: Diagnosis not present

## 2024-05-18 DIAGNOSIS — Z419 Encounter for procedure for purposes other than remedying health state, unspecified: Secondary | ICD-10-CM | POA: Diagnosis not present

## 2024-06-11 ENCOUNTER — Ambulatory Visit: Admitting: Family Medicine

## 2024-06-11 NOTE — Progress Notes (Deleted)
 Subjective: CC:*** PCP: Vickie Norene HERO, DO [name] is a 23 y.o. female presenting to clinic today for:  1. ***   ROS: Per HPI  No Known Allergies Past Medical History:  Diagnosis Date   ADHD    Allergy    Anxiety    Depression     Current Outpatient Medications:    CONCERTA 27 MG CR tablet, Take 27 mg by mouth daily. (Patient not taking: Reported on 06/23/2022), Disp: , Rfl:  Social History   Socioeconomic History   Marital status: Single    Spouse name: Not on file   Number of children: Not on file   Years of education: Not on file   Highest education level: Not on file  Occupational History   Occupation: Student     Comment: [employer], psychology  Tobacco Use   Smoking status: Never   Smokeless tobacco: Never  Vaping Use   Vaping status: Some Days  Substance and Sexual Activity   Alcohol use: Not Currently    Comment: occ   Drug use: Not Currently   Sexual activity: Yes    Birth control/protection: Condom  Other Topics Concern   Not on file  Social History Narrative   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  She has a boyfriend.   Social Drivers of Corporate investment banker Strain: Low Risk  (11/25/2020)   Received from [facility]   Overall Financial Resource Strain (CARDIA)    Difficulty of Paying Living Expenses: Not hard at all  Food Insecurity: Food Insecurity Present (08/09/2023)   Received from [facility]   Hunger Vital Sign    Within the past 12 months, you worried that your food would run out before you got the money to buy more.: Sometimes true    Within the past 12 months, the food you bought just didn't last and you didn't have money to get more.: Never true  Transportation Needs: No Transportation Needs (08/09/2023)   Received from [facility] - Transportation    Lack of Transportation (Medical): No    Lack of Transportation (Non-Medical): No  Physical Activity: Not on file  Stress:  No Stress Concern Present (08/09/2023)   Received from [facility] - Occupational Stress Questionnaire    Feeling of Stress : Not at all  Social Connections: Unknown (03/19/2022)   Received from [facility]   Social Network    Social Network: Not on file  Intimate Partner Violence: Not At Risk (10/29/2023)   Received from [facility]   HITS    Over the last 12 months how often did your partner physically hurt you?: Never    Over the last 12 months how often did your partner insult you or talk down to you?: Never    Over the last 12 months how often did your partner threaten you with physical harm?: Never    Over the last 12 months how often did your partner scream or curse at you?: Never   Family History  Problem Relation Age of Onset   Hypertension Mother    Hypothyroidism Mother    Depression Mother    Anxiety disorder Mother    Bipolar disorder Mother    Hyperlipidemia Mother    Migraines Mother    Multiple sclerosis Father    Depression Father    Bipolar disorder Father    Hypertension Father    Anxiety disorder  Maternal Grandmother    Hyperlipidemia Maternal Grandfather    Hypertension Maternal Grandfather    Nephrolithiasis Maternal Grandfather    Diabetes Paternal Grandmother    Diabetes Paternal Grandfather    Heart attack Paternal Grandfather    Anxiety disorder Half-Brother    Migraines Half-Sister    Bipolar disorder Half-Sister    Anxiety disorder Half-Sister     Objective: Office vital signs reviewed. There were no vitals taken for this visit.  Physical Examination:  General: Awake, alert, *** nourished, No acute distress HEENT: Normal    Neck: No masses palpated. No lymphadenopathy    Ears: Tympanic membranes intact, normal light reflex, no erythema, no bulging    Eyes: PERRLA, extraocular membranes intact, sclera ***    Nose: nasal turbinates moist, *** nasal discharge    Throat: moist mucus membranes,  no erythema, *** tonsillar exudate.  Airway is patent Cardio: regular rate and rhythm, S1S2 heard, no murmurs appreciated Pulm: clear to auscultation bilaterally, no wheezes, rhonchi or rales; normal work of breathing on room air GI: soft, non-tender, non-distended, bowel sounds present x4, no hepatomegaly, no splenomegaly, no masses GU: external vaginal tissue ***, cervix ***, *** punctate lesions on cervix appreciated, *** discharge from cervical os, *** bleeding, *** cervical motion tenderness, *** abdominal/ adnexal masses Extremities: warm, well perfused, No edema, cyanosis or clubbing; +*** pulses bilaterally MSK: *** gait and *** station Skin: dry; intact; no rashes or lesions Neuro: *** Strength and light touch sensation grossly intact, *** DTRs ***/4  Assessment/ Plan: 23 y.o. female   No diagnosis found.  ***   Vickie Dillon CHRISTELLA Fielding, DO [facility] [phone]

## 2024-06-14 ENCOUNTER — Encounter: Payer: Self-pay | Admitting: Family Medicine

## 2024-06-18 DIAGNOSIS — Z419 Encounter for procedure for purposes other than remedying health state, unspecified: Secondary | ICD-10-CM | POA: Diagnosis not present

## 2024-07-11 DIAGNOSIS — F411 Generalized anxiety disorder: Secondary | ICD-10-CM | POA: Diagnosis not present

## 2024-07-11 DIAGNOSIS — F324 Major depressive disorder, single episode, in partial remission: Secondary | ICD-10-CM | POA: Diagnosis not present

## 2024-07-11 DIAGNOSIS — Z5941 Food insecurity: Secondary | ICD-10-CM | POA: Diagnosis not present

## 2024-07-11 DIAGNOSIS — Z818 Family history of other mental and behavioral disorders: Secondary | ICD-10-CM | POA: Diagnosis not present

## 2024-07-11 DIAGNOSIS — F53 Postpartum depression: Secondary | ICD-10-CM | POA: Diagnosis not present

## 2024-07-11 DIAGNOSIS — Z6841 Body Mass Index (BMI) 40.0 and over, adult: Secondary | ICD-10-CM | POA: Diagnosis not present

## 2024-07-11 DIAGNOSIS — F909 Attention-deficit hyperactivity disorder, unspecified type: Secondary | ICD-10-CM | POA: Diagnosis not present

## 2024-07-16 ENCOUNTER — Ambulatory Visit: Admitting: Family Medicine

## 2024-07-16 ENCOUNTER — Encounter: Payer: Self-pay | Admitting: Family Medicine

## 2024-07-16 VITALS — BP 108/64 | HR 73 | Temp 97.9°F | Ht 65.0 in | Wt 241.5 lb

## 2024-07-16 DIAGNOSIS — Z6841 Body Mass Index (BMI) 40.0 and over, adult: Secondary | ICD-10-CM | POA: Diagnosis not present

## 2024-07-16 DIAGNOSIS — F32A Depression, unspecified: Secondary | ICD-10-CM | POA: Diagnosis not present

## 2024-07-16 DIAGNOSIS — F411 Generalized anxiety disorder: Secondary | ICD-10-CM | POA: Diagnosis not present

## 2024-07-16 LAB — BAYER DCA HB A1C WAIVED: HB A1C (BAYER DCA - WAIVED): 5 % (ref 4.8–5.6)

## 2024-07-16 MED ORDER — WEGOVY 0.25 MG/0.5ML ~~LOC~~ SOAJ: 0.2500 mg | SUBCUTANEOUS | 0 refills | Status: AC

## 2024-07-16 MED ORDER — WEGOVY 0.5 MG/0.5ML ~~LOC~~ SOAJ: 0.5000 mg | SUBCUTANEOUS | 3 refills | Status: AC

## 2024-07-16 MED ORDER — ONDANSETRON 4 MG PO TBDP: 4.0000 mg | ORAL_TABLET | Freq: Three times a day (TID) | ORAL | 0 refills | Status: AC | PRN

## 2024-07-16 NOTE — Patient Instructions (Signed)
Tips for success with Wegovy (and by success, how not to be super sick on your stomach): Eat small meals AVOID heavy foods (fried/ high in carbs like bread, pasta, rice) AVOID carbonated beverages (soda/ beer, as these can increase bloating) DOUBLE your water intake (will help you avoid constipation/ dehydration)  ZOXWRU CAN cause: Nausea Abdominal pain Increased acid reflux (sometimes presents as "sour burps") Constipation OR Diarrhea Fatigue (especially when you first start it)

## 2024-07-16 NOTE — Progress Notes (Signed)
 Subjective: RR:nazdpub PCP: [name], DO YEP:[name] is a 23 y.o. female presenting to clinic today for:  Discussed the use of AI scribe software for clinical note transcription with the patient, who gave verbal consent to proceed.  History of Present Illness   Vickie Dillon is a 23 year old female who presents with significant weight gain and difficulty losing weight postpartum.  She has experienced significant weight gain over the past three and a half years, particularly after her second pregnancy and C-section delivery a year ago. Her weight has been stable for the past six months despite attempts at dieting and exercise. Initially, her weight decreased while breastfeeding but increased again after stopping.  Her current diet includes scrambled eggs, avocado toast, or cereal for breakfast, with fish or chicken, rice, and vegetables for lunch and dinner. 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 She avoids sweets, soda, and fast food due to financial constraints.  Her exercise routine includes walking or jogging, but she experienced significant left knee pain after increasing her activity level, which limited her ability to continue. She has not been to the gym since her last pregnancy.  She has a history of using birth control, including the implant, which caused significant weight gain and nausea. She is currently not using any contraception as her partner is away, and she is not sexually active.  Family history is notable for her mother having a thyroid  disorder (she was unsure whether it was hyperthyroidism or hypothyroidism) and her father having a stroke and multiple sclerosis. Her paternal grandfather died of a heart attack.  She has not tried over-the-counter weight loss medications and has avoided restrictive diets due to past difficulties with adherence. She has concerns about her mental health, including anxiety and stress, which she  feels contribute to her weight issues.  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 She reports difficulty sleeping and feeling fatigued.         ROS: Per HPI  No Known Allergies Past Medical History:  Diagnosis Date   ADHD    Allergy    Anxiety    Depression     Current Outpatient Medications:    ondansetron  (ZOFRAN -ODT) 4 MG disintegrating tablet, Take 1 tablet (4 mg total) by mouth every 8 (eight) hours as needed for nausea or vomiting., Disp: 20 tablet, Rfl: 0   semaglutide -weight management (WEGOVY ) 0.25 MG/0.5ML SOAJ SQ injection, Inject 0.25 mg into the skin every 7 (seven) days., Disp: 2 mL, Rfl: 0   semaglutide -weight management (WEGOVY ) 0.5 MG/0.5ML SOAJ SQ injection, Inject 0.5 mg into the skin every 7 (seven) days., Disp: 2 mL, Rfl: 3   CONCERTA 27 MG CR tablet, Take 27 mg by mouth daily. (Patient not taking: Reported on 07/16/2024), Disp: , Rfl:  Social History   Socioeconomic History   Marital status: Single    Spouse name: Not on file   Number of children: Not on file   Years of education: Not on file   Highest education level: Not on file  Occupational History   Occupation: Student     Comment: [employer], psychology  Tobacco Use   Smoking status: Never   Smokeless tobacco: Never  Vaping Use   Vaping status: Some Days  Substance and Sexual Activity   Alcohol use: Not Currently    Comment: occ   Drug use: Not Currently  Sexual activity: Yes    Birth control/protection: Condom  Other Topics Concern   Not on file  Social History Narrative   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  She has a boyfriend.   Social Drivers of Corporate investment banker Strain: Low Risk  (11/25/2020)   Received from [facility]   Overall Financial Resource Strain (CARDIA)    Difficulty of Paying Living Expenses: Not hard at all  Food  Insecurity: Food Insecurity Present (08/09/2023)   Received from [facility]   Hunger Vital Sign    Within the past 12 months, you worried that your food would run out before you got the money to buy more.: Sometimes true    Within the past 12 months, the food you bought just didn't last and you didn't have money to get more.: Never true  Transportation Needs: No Transportation Needs (08/09/2023)   Received from [facility] - Transportation    Lack of Transportation (Medical): No    Lack of Transportation (Non-Medical): No  Physical Activity: Not on file  Stress: No Stress Concern Present (08/09/2023)   Received from [facility] - Occupational Stress Questionnaire    Feeling of Stress : Not at all  Social Connections: Unknown (03/19/2022)   Received from [facility]   Social Network    Social Network: Not on file  Intimate Partner Violence: Not At Risk (10/29/2023)   Received from [facility]   HITS    Over the last 12 months how often did your partner physically hurt you?: Never    Over the last 12 months how often did your partner insult you or talk down to you?: Never    Over the last 12 months how often did your partner threaten you with physical harm?: Never    Over the last 12 months how often did your partner scream or curse at you?: Never   Family History  Problem Relation Age of Onset   Hypertension Mother    Hypothyroidism Mother    Depression Mother    Anxiety disorder Mother    Bipolar disorder Mother    Hyperlipidemia Mother    Migraines Mother    Multiple sclerosis Father    Depression Father    Bipolar disorder Father    Hypertension Father    Anxiety disorder Maternal Grandmother    Hyperlipidemia Maternal Grandfather    Hypertension Maternal Grandfather    Nephrolithiasis Maternal Grandfather    Diabetes Paternal Grandmother    Diabetes Paternal Grandfather    Heart attack Paternal Grandfather     Anxiety disorder Half-Brother    Migraines Half-Sister    Bipolar disorder Half-Sister    Anxiety disorder Half-Sister     Objective: Office vital signs reviewed. BP 108/64   Pulse 73   Temp 97.9 F (36.6 C)   Ht 5' 5 (1.651 m)   Wt 241 lb 8 oz (109.5 kg)   SpO2 94%   BMI 40.19 kg/m   Physical Examination:  General: Awake, alert, morbidly obese, No acute distress HEENT: Sclera white.  No exophthalmos.  No goiter Cardio: regular rate and rhythm, S1S2 heard, no murmurs appreciated Pulm: clear to auscultation bilaterally, no wheezes, rhonchi or rales; normal work of breathing on room air     07/16/2024    4:16 PM 06/23/2022    9:29 AM 01/06/2022    9:19 AM  Depression screen PHQ 2/9  Decreased Interest 0 1 0  Down, Depressed, Hopeless 2 1 1   PHQ - 2 Score 2 2 1   Altered sleeping 3 0 3  Tired, decreased energy 3 3 1   Change in appetite 2 0 2  Feeling bad or failure about yourself  2 1   Trouble concentrating 0 0   Moving slowly or fidgety/restless 0 0   Suicidal thoughts 0 0   PHQ-9 Score 12 6 7   Difficult doing work/chores Somewhat difficult Not difficult at all Not difficult at all      07/16/2024    4:17 PM 06/23/2022    9:30 AM 01/06/2022    9:21 AM 12/07/2021   11:36 AM  GAD 7 : Generalized Anxiety Score  Nervous, Anxious, on Edge 1 1 2 1   Control/stop worrying 3 0 2 3  Worry too much - different things 3 3 3 3   Trouble relaxing 3 3 3 3   Restless 0 2 0 0  Easily annoyed or irritable 2 1 0 0  Afraid - awful might happen 1 1 1 1   Total GAD 7 Score 13 11 11 11   Anxiety Difficulty Somewhat difficult Somewhat difficult Not difficult at all Somewhat difficult    Assessment/ Plan: 23 y.o. female   Morbid obesity with BMI of 40.0-44.9, adult (HCC) - Plan: Bayer DCA Hb A1c Waived, CMP14+EGFR, Lipid Panel, TSH + free T4, VITAMIN D  25 Hydroxy (Vit-D Deficiency, Fractures), semaglutide -weight management (WEGOVY ) 0.25 MG/0.5ML SOAJ SQ injection, semaglutide -weight  management (WEGOVY ) 0.5 MG/0.5ML SOAJ SQ injection, ondansetron  (ZOFRAN -ODT) 4 MG disintegrating tablet  Generalized anxiety disorder - Plan: Ambulatory referral to Psychiatry  Depressive disorder - Plan: Ambulatory referral to Psychiatry  Assessment and Plan    Morbid obesity Morbid obesity with BMI over 40, post-pregnancy weight gain, and difficulty losing weight despite dietary and exercise efforts. Eligible for pharmacological intervention. - Prescribe Wegovy  for weight loss. - Advise on potential side effects of Wegovy , including nausea and GI upset, especially with overeating or carb-heavy foods. - Discuss the importance of adequate protein intake and hydration. - Recommend abstinence or use of condoms if sexual activity resumes due to increased fertility risk with Wegovy . - Plan follow-up in 3-4 months to assess weight loss progress and adjust dosage if necessary. - Discussed possibility of medicaid stopping coverage of this medication      MDD with GAD Patient previously under the care of psychiatry.  She will be establishing care again.  I have gone ahead and placed referral back to her previous clinic.     Norene CHRISTELLA Fielding, DO [facility] [phone]

## 2024-07-17 ENCOUNTER — Encounter: Payer: Self-pay | Admitting: Family Medicine

## 2024-07-17 ENCOUNTER — Ambulatory Visit: Payer: Self-pay | Admitting: Family Medicine

## 2024-07-17 DIAGNOSIS — R7989 Other specified abnormal findings of blood chemistry: Secondary | ICD-10-CM

## 2024-07-17 DIAGNOSIS — Z8349 Family history of other endocrine, nutritional and metabolic diseases: Secondary | ICD-10-CM

## 2024-07-17 LAB — TSH+FREE T4
Free T4: 1.3 ng/dL (ref 0.82–1.77)
TSH: 4.51 u[IU]/mL — ABNORMAL HIGH (ref 0.450–4.500)

## 2024-07-17 LAB — LIPID PANEL
Chol/HDL Ratio: 3.2 ratio (ref 0.0–4.4)
Cholesterol, Total: 172 mg/dL (ref 100–199)
HDL: 53 mg/dL (ref >39)
LDL Chol Calc (NIH): 105 mg/dL — ABNORMAL HIGH (ref 0–99)
Triglycerides: 76 mg/dL (ref 0–149)
VLDL Cholesterol Cal: 14 mg/dL (ref 5–40)

## 2024-07-17 LAB — CMP14+EGFR
ALT: 9 IU/L (ref 0–32)
AST: 13 IU/L (ref 0–40)
Albumin: 4.4 g/dL (ref 4.0–5.0)
Alkaline Phosphatase: 101 IU/L (ref 44–121)
BUN/Creatinine Ratio: 15 (ref 9–23)
BUN: 12 mg/dL (ref 6–20)
Bilirubin Total: 0.3 mg/dL (ref 0.0–1.2)
CO2: 22 mmol/L (ref 20–29)
Calcium: 9.5 mg/dL (ref 8.7–10.2)
Chloride: 104 mmol/L (ref 96–106)
Creatinine, Ser: 0.8 mg/dL (ref 0.57–1.00)
Globulin, Total: 2.9 g/dL (ref 1.5–4.5)
Glucose: 87 mg/dL (ref 70–99)
Potassium: 4.3 mmol/L (ref 3.5–5.2)
Sodium: 140 mmol/L (ref 134–144)
Total Protein: 7.3 g/dL (ref 6.0–8.5)
eGFR: 105 mL/min/1.73 (ref >59)

## 2024-07-17 LAB — VITAMIN D 25 HYDROXY (VIT D DEFICIENCY, FRACTURES): Vit D, 25-Hydroxy: 24.2 ng/mL — ABNORMAL LOW (ref 30.0–100.0)

## 2024-07-19 DIAGNOSIS — Z419 Encounter for procedure for purposes other than remedying health state, unspecified: Secondary | ICD-10-CM | POA: Diagnosis not present

## 2024-07-22 ENCOUNTER — Telehealth: Payer: Self-pay

## 2024-07-22 NOTE — Telephone Encounter (Signed)
 Copied from CRM #[id number]. Topic: Clinical - Medication Prior Auth >> Jul 22, 2024 10:57 AM Graeme ORN wrote: Reason for CRM: Patient called. States Wegovy  not yet approved. Would like status update for PA. Thank You

## 2024-07-22 NOTE — Telephone Encounter (Signed)
 PA request has been Submitted. New Encounter has been or will be created for follow up. For additional info see Pharmacy Prior Auth telephone encounter from 07/22/24.

## 2024-07-22 NOTE — Telephone Encounter (Signed)
 Pharmacy Patient Advocate Encounter   Received notification from Pt Calls Messages that prior authorization for Wegovy  0.25MG /0.5ML auto-injectors  is required/requested.   Insurance verification completed.   The patient is insured through [facility] MEDICAID .   Per test claim: PA required; PA submitted to above mentioned insurance via Latent Key/confirmation #/EOC [id number] Status is pending

## 2024-07-23 ENCOUNTER — Other Ambulatory Visit (HOSPITAL_COMMUNITY): Payer: Self-pay

## 2024-07-23 NOTE — Telephone Encounter (Signed)
 Pharmacy Patient Advocate Encounter  Received notification from [facility] MEDICAID that Prior Authorization for Wegovy  0.25MG /0.5ML auto-injectors  has been APPROVED from 07/22/24 to 01/18/25. Ran test claim, Copay is $0. This test claim was processed through [facility]- copay amounts may vary at other pharmacies due to pharmacy/plan contracts, or as the patient moves through the different stages of their insurance plan.   PA #/Case ID/Reference #: [id number]

## 2024-07-23 NOTE — Telephone Encounter (Signed)
 Approved.

## 2024-08-14 NOTE — Telephone Encounter (Signed)

## 2024-08-14 NOTE — Telephone Encounter (Signed)
 Patient called in states received info from medicaid that wegovy , wot be covered s of 10/01 and wants to know next steps

## 2024-08-16 NOTE — Telephone Encounter (Signed)
 Called and spoke with patient and made her aware of this insurance change/decision. Patient voiced understanding.

## 2024-08-16 NOTE — Telephone Encounter (Signed)
 She does not have CAD/ sleep apnea so medicaid has stopped paying for weight loss drugs as of 08/07/2024.  There sadly is no way to override their decision

## 2024-10-10 DIAGNOSIS — F419 Anxiety disorder, unspecified: Secondary | ICD-10-CM | POA: Diagnosis not present

## 2024-10-10 DIAGNOSIS — F331 Major depressive disorder, recurrent, moderate: Secondary | ICD-10-CM | POA: Diagnosis not present

## 2024-10-10 DIAGNOSIS — F411 Generalized anxiety disorder: Secondary | ICD-10-CM | POA: Diagnosis not present

## 2024-10-10 DIAGNOSIS — F902 Attention-deficit hyperactivity disorder, combined type: Secondary | ICD-10-CM | POA: Diagnosis not present

## 2024-10-16 ENCOUNTER — Encounter: Payer: Self-pay | Admitting: Family Medicine

## 2024-10-16 ENCOUNTER — Ambulatory Visit: Payer: Self-pay | Admitting: Family Medicine

## 2024-10-16 ENCOUNTER — Telehealth: Payer: Self-pay

## 2024-10-16 NOTE — Telephone Encounter (Signed)
 Left message for patient to call back. Patient is coming into office today for weight management but has not been on weight management medication. Dr. Jolinda wanted to follow up to see if today's appointment is needed.
